# Patient Record
Sex: Female | Born: 1990 | Race: White | Hispanic: No | Marital: Single | Smoking: Never smoker
Health system: Southern US, Community
[De-identification: ages and names within clinical notes are randomized; demographics above are authoritative.]

## PROBLEM LIST (undated history)

## (undated) ENCOUNTER — Inpatient Hospital Stay (HOSPITAL_COMMUNITY): Payer: Self-pay

## (undated) DIAGNOSIS — F419 Anxiety disorder, unspecified: Secondary | ICD-10-CM

## (undated) DIAGNOSIS — F909 Attention-deficit hyperactivity disorder, unspecified type: Secondary | ICD-10-CM

## (undated) DIAGNOSIS — J45909 Unspecified asthma, uncomplicated: Secondary | ICD-10-CM

## (undated) DIAGNOSIS — F32A Depression, unspecified: Secondary | ICD-10-CM

## (undated) DIAGNOSIS — N189 Chronic kidney disease, unspecified: Secondary | ICD-10-CM

## (undated) DIAGNOSIS — E669 Obesity, unspecified: Secondary | ICD-10-CM

## (undated) DIAGNOSIS — R51 Headache: Secondary | ICD-10-CM

## (undated) DIAGNOSIS — F4310 Post-traumatic stress disorder, unspecified: Principal | ICD-10-CM

## (undated) DIAGNOSIS — F329 Major depressive disorder, single episode, unspecified: Secondary | ICD-10-CM

## (undated) HISTORY — DX: Anxiety disorder, unspecified: F41.9

## (undated) HISTORY — DX: Depression, unspecified: F32.A

## (undated) HISTORY — DX: Major depressive disorder, single episode, unspecified: F32.9

## (undated) HISTORY — DX: Obesity, unspecified: E66.9

## (undated) HISTORY — DX: Headache: R51

## (undated) HISTORY — PX: WISDOM TOOTH EXTRACTION: SHX21

## (undated) HISTORY — DX: Attention-deficit hyperactivity disorder, unspecified type: F90.9

## (undated) HISTORY — DX: Post-traumatic stress disorder, unspecified: F43.10

---
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
## 2012-01-14 ENCOUNTER — Telehealth (HOSPITAL_COMMUNITY): Payer: Self-pay

## 2012-01-14 ENCOUNTER — Other Ambulatory Visit (HOSPITAL_COMMUNITY): Payer: Self-pay | Admitting: Psychiatry

## 2012-01-14 MED ORDER — DIPHENHYDRAMINE HCL 25 MG PO CAPS: 25.0000 mg | ORAL_CAPSULE | Freq: Every evening | ORAL | Status: DC | PRN

## 2012-01-14 NOTE — Telephone Encounter (Signed)
Sent script to pharmacy. 

## 2012-02-02 ENCOUNTER — Ambulatory Visit (HOSPITAL_COMMUNITY): Payer: Self-pay | Admitting: Psychiatry

## 2012-03-22 ENCOUNTER — Other Ambulatory Visit (HOSPITAL_COMMUNITY): Payer: Self-pay | Admitting: Psychiatry

## 2012-03-30 ENCOUNTER — Other Ambulatory Visit (HOSPITAL_COMMUNITY): Payer: Self-pay | Admitting: Psychiatry

## 2012-03-30 DIAGNOSIS — F329 Major depressive disorder, single episode, unspecified: Secondary | ICD-10-CM

## 2012-03-30 MED ORDER — BUPROPION HCL ER (XL) 300 MG PO TB24: 300.0000 mg | ORAL_TABLET | Freq: Every day | ORAL | Status: DC

## 2012-04-10 ENCOUNTER — Other Ambulatory Visit: Payer: Self-pay | Admitting: Physical Medicine and Rehabilitation

## 2012-04-10 DIAGNOSIS — M79603 Pain in arm, unspecified: Secondary | ICD-10-CM

## 2012-04-10 DIAGNOSIS — M25519 Pain in unspecified shoulder: Secondary | ICD-10-CM

## 2012-04-10 DIAGNOSIS — M542 Cervicalgia: Secondary | ICD-10-CM

## 2012-04-12 ENCOUNTER — Other Ambulatory Visit (HOSPITAL_COMMUNITY): Payer: Self-pay | Admitting: Psychiatry

## 2012-04-12 MED ORDER — FLUOXETINE HCL 20 MG PO TABS: 20.0000 mg | ORAL_TABLET | Freq: Every day | ORAL | Status: DC

## 2012-04-19 ENCOUNTER — Ambulatory Visit
Admission: RE | Admit: 2012-04-19 | Discharge: 2012-04-19 | Disposition: A | Discharge status: Home / Self Care | Payer: Medicaid Other | Source: Ambulatory Visit | Attending: Physical Medicine and Rehabilitation | Admitting: Physical Medicine and Rehabilitation

## 2012-04-19 DIAGNOSIS — M25519 Pain in unspecified shoulder: Secondary | ICD-10-CM

## 2012-04-19 DIAGNOSIS — M542 Cervicalgia: Secondary | ICD-10-CM

## 2012-04-19 DIAGNOSIS — M79603 Pain in arm, unspecified: Secondary | ICD-10-CM

## 2012-04-21 ENCOUNTER — Telehealth (HOSPITAL_COMMUNITY): Payer: Self-pay

## 2012-04-21 NOTE — Telephone Encounter (Signed)
Needs appt

## 2012-04-21 NOTE — Telephone Encounter (Signed)
Needs to discuss meds 

## 2012-04-27 ENCOUNTER — Ambulatory Visit (INDEPENDENT_AMBULATORY_CARE_PROVIDER_SITE_OTHER): Payer: Medicaid Other | Admitting: Psychiatry

## 2012-04-27 VITALS — BP 112/72 | Ht 59.0 in | Wt 192.0 lb

## 2012-04-27 DIAGNOSIS — F411 Generalized anxiety disorder: Secondary | ICD-10-CM

## 2012-04-27 DIAGNOSIS — F331 Major depressive disorder, recurrent, moderate: Secondary | ICD-10-CM

## 2012-04-27 DIAGNOSIS — F329 Major depressive disorder, single episode, unspecified: Secondary | ICD-10-CM

## 2012-04-27 MED ORDER — FLUOXETINE HCL 20 MG PO TABS: 40.0000 mg | ORAL_TABLET | Freq: Every day | ORAL | Status: DC

## 2012-04-28 ENCOUNTER — Encounter (HOSPITAL_COMMUNITY): Payer: Self-pay | Admitting: Psychiatry

## 2012-04-28 NOTE — Progress Notes (Signed)
   [facility] Follow-up Outpatient Visit  Meghan [name] 07/10/1991   Subjective: The patient is an 21 year old female well known to me, as I was her treatment provider from June of 2009 up until September 2011. Her chart was closed secondary to no shows and noncompliance. The patient started coming to the clinic again this year. She reinitiated treatment with Dr. Ferol Luz in our office, and subsequently transferred care back to me. I saw her for an initial visit in February. The patient has completed middle college. She continues to date and has for 8 months. The patient has not been seen since February although she was supposed to come back in 6 weeks. She has been to [place] since last visit. She did not like it, and thought it was dirty. The patient reports today that her anxiety is worse. She states she is biting her nails off. She still trying to move out. 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 She is hoping to enter a nursing program. She plans to start attending classes at [employer]. She is asking for a note for school so that she may wear acrylic nails to keep from biting them.  Filed Vitals:   04/28/12 0857  BP: 112/72    Mental Status Examination  Appearance: Casual Alert: Yes Attention: fair  Cooperative: Yes Eye Contact: Good Speech: Regular rate rhythm and volume Psychomotor Activity: Normal Memory/Concentration: Intact Oriented: person, place, time/date and situation Mood: Euthymic Affect: Congruent Thought Processes and Associations: Logical Fund of Knowledge: Fair Thought Content: No suicidal or homicidal thoughts Insight: Fair Judgement: Fair  Diagnosis: Maj. depressive disorder, recurrent, moderate; generalized anxiety disorder  Treatment Plan: I will increase Prozac to 40 mg daily. We will continue the Wellbutrin XL, and the Benadryl for sleep. I will see  the patient back in 6 weeks. Patient counseled that she must keep appointments. She may call with concerns.  Jamse Mead, MD

## 2012-06-09 ENCOUNTER — Ambulatory Visit (INDEPENDENT_AMBULATORY_CARE_PROVIDER_SITE_OTHER): Payer: Medicaid Other | Admitting: Psychiatry

## 2012-06-09 ENCOUNTER — Encounter (HOSPITAL_COMMUNITY): Payer: Self-pay | Admitting: Psychiatry

## 2012-06-09 VITALS — BP 126/86 | Ht 59.0 in | Wt 194.0 lb

## 2012-06-09 DIAGNOSIS — F411 Generalized anxiety disorder: Secondary | ICD-10-CM

## 2012-06-09 DIAGNOSIS — F329 Major depressive disorder, single episode, unspecified: Secondary | ICD-10-CM

## 2012-06-09 DIAGNOSIS — F331 Major depressive disorder, recurrent, moderate: Secondary | ICD-10-CM

## 2012-06-09 NOTE — Progress Notes (Signed)
   [facility] Follow-up Outpatient Visit  ROBINN OVERHOLT 1991/05/23   Subjective: The patient is an 21 year old female well known to me, as I was her treatment provider from June of 2009 up until September 2011. Her chart was closed secondary to no shows and noncompliance. The patient started coming to the clinic again this year. She reinitiated treatment with Dr. Ferol Luz in our office, and subsequently transferred care back to me. I have been treating her again since February of 2013. The patient reports she has had a very long summer. 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 She presents today with her younger sister, so she somewhat guarded in the appointment. She reports her stress is still going on, but states that it should be over soon which implies to me that she is moving out of her parents house. She endorses good sleep and appetite. She still has been chewing somewhat on her nails, but is currently wearing acrylic so it does nothing. The patient does not want to change medication today.  Filed Vitals:   06/09/12 1607  BP: 126/86    Mental Status Examination  Appearance: Casual Alert: Yes Attention: fair  Cooperative: Yes Eye Contact: Good Speech: Regular rate rhythm and volume Psychomotor Activity: Normal Memory/Concentration: Intact Oriented: person, place, time/date and situation Mood: Euthymic Affect: Congruent Thought Processes and Associations: Logical Fund of Knowledge: Fair Thought Content: No suicidal or homicidal thoughts Insight: Fair Judgement: Fair  Diagnosis: Maj. depressive disorder, recurrent, moderate; generalized anxiety disorder  Treatment Plan: I will not make any changes today. I will continue the Prozac, Wellbutrin XL, and Benadryl. I will see the patient back in 2 months.  Patient may call with concerns.  Jamse Mead, MD

## 2012-06-29 ENCOUNTER — Telehealth (HOSPITAL_COMMUNITY): Payer: Self-pay

## 2012-06-29 NOTE — Telephone Encounter (Signed)
OK 

## 2012-06-30 DIAGNOSIS — Z3009 Encounter for other general counseling and advice on contraception: Secondary | ICD-10-CM | POA: Insufficient documentation

## 2012-07-04 ENCOUNTER — Ambulatory Visit (HOSPITAL_COMMUNITY): Payer: Self-pay | Admitting: Psychiatry

## 2012-08-21 ENCOUNTER — Other Ambulatory Visit (HOSPITAL_COMMUNITY): Payer: Self-pay | Admitting: Psychiatry

## 2012-08-21 MED ORDER — FLUOXETINE HCL 20 MG PO TABS: 40.0000 mg | ORAL_TABLET | Freq: Every day | ORAL | Status: DC

## 2012-08-22 ENCOUNTER — Ambulatory Visit (INDEPENDENT_AMBULATORY_CARE_PROVIDER_SITE_OTHER): Payer: Medicaid Other | Admitting: Psychiatry

## 2012-08-22 VITALS — BP 100/62 | Ht 59.0 in | Wt 196.0 lb

## 2012-08-22 DIAGNOSIS — F411 Generalized anxiety disorder: Secondary | ICD-10-CM

## 2012-08-22 DIAGNOSIS — F331 Major depressive disorder, recurrent, moderate: Secondary | ICD-10-CM

## 2012-08-22 DIAGNOSIS — F329 Major depressive disorder, single episode, unspecified: Secondary | ICD-10-CM

## 2012-08-22 LAB — OB RESULTS CONSOLE ABO/RH: RH Type: POSITIVE

## 2012-08-22 LAB — OB RESULTS CONSOLE ANTIBODY SCREEN: Antibody Screen: NEGATIVE

## 2012-08-22 LAB — OB RESULTS CONSOLE GC/CHLAMYDIA: Gonorrhea: NEGATIVE

## 2012-08-22 MED ORDER — BUPROPION HCL ER (XL) 150 MG PO TB24: 150.0000 mg | ORAL_TABLET | Freq: Every day | ORAL | Status: DC

## 2012-08-23 ENCOUNTER — Encounter (HOSPITAL_COMMUNITY): Payer: Self-pay | Admitting: Psychiatry

## 2012-08-23 NOTE — Progress Notes (Signed)
   [facility] Health Follow-up Outpatient Visit  Meghan [name] [birth year]   Subjective: The patient is an 21 year old female well known to me, as I was her treatment provider from June of 2009 up until September 2011. Her chart was closed secondary to no shows and noncompliance. The patient started coming to the clinic again this year. She reinitiated treatment with Dr. Ferol Luz in our office, and subsequently transferred care back to me. I have been treating her again since February of 2013. The patient is diagnosed with generalized anxiety disorder, Maj. depressive disorder. She reports that since her last appointment, she has stopped her medication. She is currently [redacted] weeks pregnant. She plans to move out and in with her boyfriend at [employer]. Her mom does not know. 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 The patient endorses a lot of crying spells. She has been down a lot. Grades are going well. 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 Although she has been with her current boyfriend for 2 years, her parents do not approve. He is 6 years older. She is excited about the pregnancy. Her mother does not know.  Filed Vitals:   08/23/12 0922  BP: 100/62    Mental Status Examination  Appearance: Casual Alert: Yes Attention: fair  Cooperative: Yes Eye Contact: Good Speech: Regular rate rhythm and volume Psychomotor Activity: Normal Memory/Concentration: Intact Oriented: person, place, time/date and situation Mood: Euthymic Affect: Congruent Thought Processes and Associations: Logical Fund of Knowledge: Fair Thought Content: No suicidal or homicidal thoughts Insight: Fair Judgement: Fair  Diagnosis: Maj. depressive disorder, recurrent, moderate; generalized anxiety disorder  Treatment Plan: I will restart her Wellbutrin XL at 150 mg daily. I will see the patient back in 6 weeks. Patient may  call with concerns.  Jamse Mead, MD

## 2012-10-03 ENCOUNTER — Ambulatory Visit (HOSPITAL_COMMUNITY): Payer: Self-pay | Admitting: Psychiatry

## 2012-10-27 ENCOUNTER — Ambulatory Visit (INDEPENDENT_AMBULATORY_CARE_PROVIDER_SITE_OTHER): Payer: Medicaid Other | Admitting: Psychiatry

## 2012-10-27 ENCOUNTER — Encounter (HOSPITAL_COMMUNITY): Payer: Self-pay | Admitting: Psychiatry

## 2012-10-27 VITALS — BP 104/64 | Ht 59.0 in | Wt 196.0 lb

## 2012-10-27 DIAGNOSIS — F411 Generalized anxiety disorder: Secondary | ICD-10-CM

## 2012-10-27 DIAGNOSIS — F329 Major depressive disorder, single episode, unspecified: Secondary | ICD-10-CM

## 2012-10-27 DIAGNOSIS — F331 Major depressive disorder, recurrent, moderate: Secondary | ICD-10-CM

## 2012-10-27 NOTE — Progress Notes (Signed)
   [facility] Follow-up Outpatient Visit  NELA BASCOM Jul 30, 1991   Subjective: The patient is an 21 year old female well known to me, as I was her treatment provider from June of 2009 up until September 2011. Her chart was closed secondary to no shows and noncompliance. The patient started coming to the clinic again this year. She reinitiated treatment with Dr. Ferol Luz in our office, and subsequently transferred care back to me. I have been treating her again since February of 2013. The patient is diagnosed with generalized anxiety disorder, and Maj. depressive disorder. At her last appointment, she informed that she was [redacted] weeks pregnant. She was more depressed and crying a lot. She was off her medication. I restarted her Wellbutrin XL at 150 mg daily. She presents today. She is once again off of her medication. The patient was having issues with losing weight and vomiting. She has not even allowed to take prenatal vitamins. She has her next prenatal appointment on January 4. She is the same weight today she was last visit. The patient is currently stressed. Her sister has been diagnosed with a Chiari malformation. She'll be having surgery on Monday. This is the patient's birthday. Mom is broken her arm. Maternal grandparents refuse to help because they're mad that the patient was pregnant. 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 She made straight A's. She is still waiting to move in with her boyfriend. Her mother refuses to meet him. The patient is asking to restart therapy with Darel Hong.  Filed Vitals:   10/27/12 1117  BP: 104/64    Mental Status Examination  Appearance: Casual Alert: Yes Attention: fair  Cooperative: Yes Eye Contact: Good Speech: Regular rate rhythm and volume Psychomotor Activity: Normal Memory/Concentration: Intact Oriented: person, place, time/date and situation Mood: Euthymic Affect: Congruent Thought Processes and Associations: Logical Fund  of Knowledge: Fair Thought Content: No suicidal or homicidal thoughts Insight: Fair Judgement: Fair  Diagnosis: Maj. depressive disorder, recurrent, moderate; generalized anxiety disorder  Treatment Plan: I will not continue any medication until patient has her next prenatal visit on January 4. She is to update me afterwards. I will see her back in 2 months. She may call with concerns. Patient to reschedule therapy with Darel Hong.  Jamse Mead, MD

## 2012-11-08 NOTE — L&D Delivery Note (Signed)
Delivery Note Pt progressed well on pitocin and reached complete dilation.  She had an epidural placed, but never became fully comfortable with it.  She pushed about 20 minutes and at 5:32 AM a healthy female was delivered via Vaginal, Spontaneous Delivery (Presentation: LOA).  APGAR: 8, 9; weight pending.   Placenta status: Intact, Spontaneous.  Placenta appeared a little small.   Cord: 3 vessels with the following complications: short cord.  Anesthesia: Epidural  Episiotomy: None Lacerations: Second degree Suture Repair: 3.0 vicryl rapide Est. Blood Loss (mL): 300cc  Mom to postpartum.  Baby to stay with mother. Oliver Pila 03/08/2013, 6:08 AM

## 2012-11-23 ENCOUNTER — Ambulatory Visit (HOSPITAL_COMMUNITY): Payer: Self-pay | Admitting: Licensed Clinical Social Worker

## 2012-12-03 ENCOUNTER — Inpatient Hospital Stay (HOSPITAL_COMMUNITY)
Admission: AD | Admit: 2012-12-03 | Discharge: 2012-12-05 | DRG: 781 | Disposition: A | Discharge status: Home / Self Care | Payer: Medicaid Other | Source: Ambulatory Visit | Attending: Obstetrics and Gynecology | Admitting: Obstetrics and Gynecology

## 2012-12-03 ENCOUNTER — Inpatient Hospital Stay (HOSPITAL_COMMUNITY): Payer: Medicaid Other

## 2012-12-03 ENCOUNTER — Encounter (HOSPITAL_COMMUNITY): Payer: Self-pay

## 2012-12-03 DIAGNOSIS — O26839 Pregnancy related renal disease, unspecified trimester: Principal | ICD-10-CM | POA: Diagnosis present

## 2012-12-03 DIAGNOSIS — N1330 Unspecified hydronephrosis: Secondary | ICD-10-CM | POA: Diagnosis present

## 2012-12-03 DIAGNOSIS — N132 Hydronephrosis with renal and ureteral calculous obstruction: Secondary | ICD-10-CM

## 2012-12-03 DIAGNOSIS — R109 Unspecified abdominal pain: Secondary | ICD-10-CM | POA: Diagnosis present

## 2012-12-03 DIAGNOSIS — N200 Calculus of kidney: Secondary | ICD-10-CM | POA: Diagnosis present

## 2012-12-03 LAB — COMPREHENSIVE METABOLIC PANEL
Albumin: 2.7 g/dL — ABNORMAL LOW (ref 3.5–5.2)
Alkaline Phosphatase: 85 U/L (ref 39–117)
BUN: 7 mg/dL (ref 6–23)
CO2: 22 mEq/L (ref 19–32)
Chloride: 100 mEq/L (ref 96–112)
Creatinine, Ser: 0.7 mg/dL (ref 0.50–1.10)
GFR calc non Af Amer: >90 mL/min (ref >90)
Glucose, Bld: 99 mg/dL (ref 70–99)
Potassium: 3.9 mEq/L (ref 3.5–5.1)
Total Bilirubin: 0.2 mg/dL — ABNORMAL LOW (ref 0.3–1.2)

## 2012-12-03 LAB — URINALYSIS, ROUTINE W REFLEX MICROSCOPIC
Bilirubin Urine: NEGATIVE
Glucose, UA: NEGATIVE mg/dL
Ketones, ur: NEGATIVE mg/dL
Leukocytes, UA: NEGATIVE
Protein, ur: NEGATIVE mg/dL
pH: 6 (ref 5.0–8.0)

## 2012-12-03 LAB — URINE MICROSCOPIC-ADD ON

## 2012-12-03 MED ORDER — HYDROMORPHONE HCL PF 1 MG/ML IJ SOLN
2.0000 mg | INTRAMUSCULAR | Status: DC | PRN
  Administered 2012-12-04: 1 mg via INTRAVENOUS
  Administered 2012-12-04 (×2): 2 mg via INTRAVENOUS
  Administered 2012-12-04: 1 mg via INTRAVENOUS
  Administered 2012-12-04 (×4): 2 mg via INTRAVENOUS
  Filled 2012-12-03: qty 2
  Filled 2012-12-03: qty 1
  Filled 2012-12-03 (×5): qty 2
  Filled 2012-12-03: qty 1

## 2012-12-03 MED ORDER — DOCUSATE SODIUM 100 MG PO CAPS
100.0000 mg | ORAL_CAPSULE | Freq: Every day | ORAL | Status: DC
  Administered 2012-12-03 – 2012-12-05 (×3): 100 mg via ORAL
  Filled 2012-12-03 (×3): qty 1

## 2012-12-03 MED ORDER — HYDROMORPHONE HCL PF 1 MG/ML IJ SOLN
2.0000 mg | INTRAMUSCULAR | Status: DC | PRN
  Administered 2012-12-03 (×6): 2 mg via INTRAVENOUS
  Filled 2012-12-03 (×6): qty 2

## 2012-12-03 MED ORDER — HYDROMORPHONE HCL PF 1 MG/ML IJ SOLN
2.0000 mg | Freq: Once | INTRAMUSCULAR | Status: AC
  Administered 2012-12-03: 2 mg via INTRAMUSCULAR
  Filled 2012-12-03: qty 2

## 2012-12-03 MED ORDER — ZOLPIDEM TARTRATE 5 MG PO TABS
5.0000 mg | ORAL_TABLET | Freq: Every evening | ORAL | Status: DC | PRN
  Administered 2012-12-04: 5 mg via ORAL
  Filled 2012-12-03: qty 1

## 2012-12-03 MED ORDER — PRENATAL MULTIVITAMIN CH
1.0000 | ORAL_TABLET | Freq: Every day | ORAL | Status: DC
  Administered 2012-12-03: 1 via ORAL
  Filled 2012-12-03: qty 1

## 2012-12-03 MED ORDER — LACTATED RINGERS IV SOLN
INTRAVENOUS | Status: DC
  Administered 2012-12-03: 150 mL via INTRAVENOUS
  Administered 2012-12-03 – 2012-12-04 (×6): via INTRAVENOUS
  Administered 2012-12-05: 150 mL/h via INTRAVENOUS
  Administered 2012-12-05: 16:00:00 via INTRAVENOUS

## 2012-12-03 MED ORDER — HYDROMORPHONE HCL PF 1 MG/ML IJ SOLN
1.0000 mg | Freq: Once | INTRAMUSCULAR | Status: AC
  Administered 2012-12-03: 1 mg via INTRAVENOUS
  Filled 2012-12-03: qty 1

## 2012-12-03 MED ORDER — ACETAMINOPHEN 325 MG PO TABS
650.0000 mg | ORAL_TABLET | ORAL | Status: DC | PRN
  Administered 2012-12-04: 650 mg via ORAL
  Filled 2012-12-03: qty 2

## 2012-12-03 MED ORDER — CALCIUM CARBONATE ANTACID 500 MG PO CHEW
2.0000 | CHEWABLE_TABLET | ORAL | Status: DC | PRN
  Administered 2012-12-04: 400 mg via ORAL
  Filled 2012-12-03: qty 2

## 2012-12-03 MED ORDER — LACTATED RINGERS IV BOLUS (SEPSIS)
500.0000 mL | Freq: Once | INTRAVENOUS | Status: AC
  Administered 2012-12-03: 500 mL via INTRAVENOUS

## 2012-12-03 MED ORDER — ONDANSETRON HCL 4 MG/2ML IJ SOLN
4.0000 mg | Freq: Four times a day (QID) | INTRAMUSCULAR | Status: DC | PRN
  Administered 2012-12-03 – 2012-12-05 (×4): 4 mg via INTRAVENOUS
  Filled 2012-12-03 (×4): qty 2

## 2012-12-03 NOTE — MAU Note (Signed)
Onset of back pain since 10:00 p.m. Denies vaginal bleeding.

## 2012-12-03 NOTE — Progress Notes (Signed)
Pain is mostly L flank coming around to left side of abdomen

## 2012-12-03 NOTE — MAU Provider Note (Signed)
  History     CSN: [id number]  Arrival date and time: 12/03/12 0203   First Provider Initiated Contact with Patient 12/03/12 [phone]      Chief Complaint  Patient presents with  . Back Pain   HPI  Pt is a G1P0 at 25.2 wks IUP here with report of back pain that started last night.  Pain is on lower right side and radiated around to the front lower pelvic.  No report of vaginal bleeding or problems in pregnancy.    Past Medical History  Diagnosis Date  . Anxiety   . Depression   . Headache   . Obesity (BMI 30-39.9)     Pt  doesn't  "eat much"  does not eat veggies/fruit  . ADHD (attention deficit hyperactivity disorder)     No past surgical history on file.  Family History  Problem Relation Age of Onset  . Bipolar disorder Mother   . Anxiety disorder Mother   . Bipolar disorder Maternal Grandmother   . Depression Cousin   . Bipolar disorder Other     History  Substance Use Topics  . Smoking status: Never Smoker   . Smokeless tobacco: Never Used  . Alcohol Use: No    Allergies: No Known Allergies  Prescriptions prior to admission  Medication Sig Dispense Refill  . buPROPion (WELLBUTRIN XL) 150 MG 24 hr tablet Take 1 tablet (150 mg total) by mouth daily after breakfast.  30 tablet  2  . diphenhydrAMINE (BENADRYL) 25 mg capsule Take 1 capsule (25 mg total) by mouth at bedtime as needed for itching or sleep. Take 1-2 at bedtime for sleep  60 capsule  1  . HYDROcodone-acetaminophen (VICODIN) 5-500 MG per tablet       . metaxalone (SKELAXIN) 800 MG tablet       . montelukast (SINGULAIR) 10 MG tablet         Review of Systems  Gastrointestinal: Positive for abdominal pain.  Musculoskeletal: Positive for back pain (lower right side).  All other systems reviewed and are negative.   Physical Exam   Blood pressure 111/67, pulse 87, temperature 97.5 F (36.4 C), temperature source Oral, resp. rate 18, height 4\' 11"  (1.499 m), weight 91.445 kg (201 lb 9.6  oz).  Physical Exam  Constitutional: She is oriented to person, place, and time. She appears well-developed and well-nourished.       Appears uncomfortable  HENT:  Head: Normocephalic.  Neck: Normal range of motion. Neck supple.  Cardiovascular: Normal rate, regular rhythm and normal heart sounds.   Respiratory: Effort normal and breath sounds normal.  GI: Soft. There is no tenderness.  Genitourinary: No bleeding around the vagina. Vaginal discharge (mucusy) found.       Cervix - closed, thick   Musculoskeletal:       Arms: Neurological: She is alert and oriented to person, place, and time.  Skin: Skin is warm and dry.    MAU Course  Procedures  Ultrasound: IMPRESSION:  1. Mild left-sided hydronephrosis noted; no left-sided ureteral jet seen. This raises concern for distal obstruction, due to either a distal stone or possibly hydronephrosis of pregnancy. 2. 3 mm nonobstructing stone noted at the interpole region of the left kidney.  [id number] Dr. Senaida Ores assumes care of patient.    Assessment and Plan    [facility] 12/03/2012, 3:07 AM

## 2012-12-03 NOTE — Progress Notes (Signed)
Dr. Senaida Ores notified that the pt has had of urinary output in the past 6hrs. Will cont to observe.

## 2012-12-03 NOTE — H&P (Signed)
Meghan [name] is a 22 y.o. female G1P0 at 26+ weeks gestation (EDD5/9/14 by 9 week [place]) presenting for onset of flank pain this pm that she rated a 10/10.  UA shows hematuria and renal US shows probable left kidney stone, possibly obstructing as no ureteral jet seen.  Pt will be admitted for pain control and observation to see if can pass stone.  Maternal Medical History:  Prenatal complications: Nephrolithiasis.     OB History    Grav Para Term Preterm Abortions TAB SAB Ect Mult Living   1              Past Medical History  Diagnosis Date  . Anxiety   . Depression   . Headache   . Obesity (BMI 30-39.9)     Pt  doesn't  "eat much"  does not eat veggies/fruit  . ADHD (attention deficit hyperactivity disorder)    No past surgical history on file. Family History: family history includes Anxiety disorder in her mother; Bipolar disorder in her maternal grandmother, mother, and other; and Depression in her cousin. Social History:  reports that she has never smoked. She has never used smokeless tobacco. She reports that she does not drink alcohol or use illicit drugs.   Prenatal Transfer Tool  Maternal Diabetes: not yet tested Genetic Screening: Normal Maternal Ultrasounds/Referrals: Normal Fetal Ultrasounds or other Referrals:  None Maternal Substance Abuse:  No Significant Maternal Medications:  None Significant Maternal Lab Results:  None Other Comments:  None  Review of Systems  Genitourinary: Positive for hematuria and flank pain.      Blood pressure 111/67, pulse 87, temperature 97.5 F (36.4 C), temperature source Oral, resp. rate 18, height 4\' 11"  (1.499 m), weight 91.445 kg (201 lb 9.6 oz). Maternal Exam:  Abdomen: Patient reports no abdominal tenderness. Introitus: Normal vulva. Normal vagina.    Physical Exam  Constitutional: She is oriented to person, place, and time. She appears well-developed and well-nourished.  Cardiovascular: Normal rate and regular rhythm.     Respiratory: Breath sounds normal.  GI: Soft.  Genitourinary: Vagina normal.  Neurological: She is alert and oriented to person, place, and time.    Prenatal labs: ABO, Rh:  A positive Antibody:  negative Rubella:  immune RPR:   NR HBsAg:   Neg HIV:   NR GBS:   not yet known  Assessment/Plan: Pt with nephrolithiasis based on findings of hematuria and [place] suggestive of left stone.  Will admit for IV pain control and hope stone will pass.  Oliver Pila 12/03/2012, 4:59 AM

## 2012-12-03 NOTE — Progress Notes (Signed)
Report called to [facility] in [facility]. Pt to 158 via w/c

## 2012-12-03 NOTE — Progress Notes (Signed)
Medicated for nausea.

## 2012-12-03 NOTE — Progress Notes (Signed)
Pt moves a lot and difficult to monitor baby

## 2012-12-03 NOTE — Progress Notes (Signed)
Patient ID: Meghan [name], female   DOB: 11-11-1990, 22 y.o.   MRN: [id number] Pt required another dose of dilaudid at 0630am, now going to rest. D/w her plan of care.

## 2012-12-04 ENCOUNTER — Observation Stay (HOSPITAL_COMMUNITY): Payer: Medicaid Other

## 2012-12-04 ENCOUNTER — Encounter (HOSPITAL_COMMUNITY): Payer: Self-pay | Admitting: *Deleted

## 2012-12-04 LAB — CBC
MCV: 86.7 fL (ref 78.0–100.0)
Platelets: 136 10*3/uL — ABNORMAL LOW (ref 150–400)
RBC: 3.45 MIL/uL — ABNORMAL LOW (ref 3.87–5.11)
RDW: 14 % (ref 11.5–15.5)
WBC: 11 10*3/uL — ABNORMAL HIGH (ref 4.0–10.5)

## 2012-12-04 LAB — COMPREHENSIVE METABOLIC PANEL
ALT: 18 U/L (ref 0–35)
AST: 16 U/L (ref 0–37)
Albumin: 2.1 g/dL — ABNORMAL LOW (ref 3.5–5.2)
Calcium: 8.2 mg/dL — ABNORMAL LOW (ref 8.4–10.5)
Chloride: 101 mEq/L (ref 96–112)
Creatinine, Ser: 0.85 mg/dL (ref 0.50–1.10)
Sodium: 135 mEq/L (ref 135–145)

## 2012-12-04 MED ORDER — HYDROMORPHONE HCL PF 1 MG/ML IJ SOLN: 1.0000 mg | INTRAMUSCULAR | Status: DC | PRN

## 2012-12-04 MED ORDER — HYDROMORPHONE HCL 2 MG PO TABS
4.0000 mg | ORAL_TABLET | ORAL | Status: DC | PRN
  Administered 2012-12-04 – 2012-12-05 (×7): 4 mg via ORAL
  Filled 2012-12-04: qty 2
  Filled 2012-12-04: qty 1
  Filled 2012-12-04 (×5): qty 2

## 2012-12-04 MED ORDER — TAMSULOSIN HCL 0.4 MG PO CAPS
0.4000 mg | ORAL_CAPSULE | Freq: Every day | ORAL | Status: DC
  Administered 2012-12-04 – 2012-12-05 (×2): 0.4 mg via ORAL
  Filled 2012-12-04 (×3): qty 1

## 2012-12-04 NOTE — Progress Notes (Signed)
Incentive spirometer given to pt and able to   reach 1700 ml on initial to 2500 ml evoking cough each time.  Pt encouraged to ambulate.  Caffeine drink offered to pt for complaint of HA pt reluctant to drink.

## 2012-12-04 NOTE — Progress Notes (Signed)
Patient ID: Meghan [name], female   DOB: 16-Aug-1991, 22 y.o.   MRN: [id number]   I reviewed the transvaginal ultrasound images and called and discussed these with the patient's mother Marchelle Folks who is with the patient. The transvaginal ultrasound showed bilateral ureteral jets and did not show dilation of the distal ureters.  Again I discussed with Flonnie's this doesn't rule out a ureteral stone and obstruction but it is reassuring to see bilateral a ureteral jets andno obviousLarge stones. We'll continue to monitor the patient overnight.  I'll repeat her renal ultrasound in the morning.

## 2012-12-04 NOTE — Progress Notes (Signed)
To ultrasound

## 2012-12-04 NOTE — Progress Notes (Signed)
Pt tolerated ambulating in hallway with family well complaining only of headache at this time.

## 2012-12-04 NOTE — Progress Notes (Signed)
Ur chart review completed.  

## 2012-12-04 NOTE — Progress Notes (Signed)
HD #2, 25W 3D, kidney stone Still having pain on left, requiring PO Dilaudid, nauseated Afeb, VSS Will add Flomax and get Urology eval today

## 2012-12-04 NOTE — Consult Note (Signed)
Consult:Left hydronpehrosis Flank pain IUP Nephrolithiasis Requested by Dr. Jackelyn Knife   History of Present Illness: 22 y.o. Female admitted with acute onset left flank pain yesterday. She is G1P0 at 26+ weeks gestation (EDD5/9/14 by 9 week [place]).   She continues to have left flank pain and is taking IV pain meds regularly. She has not taken po pain meds. Pain radiates into LLQ. She has had pain like this before and underwent MRI about 6 months ago for it.    This is her first pregnancy and first stone episode.    Past Medical History  Diagnosis Date  . Anxiety   . Depression   . Headache   . Obesity (BMI 30-39.9)     Pt  doesn't  "eat much"  does not eat veggies/fruit  . ADHD (attention deficit hyperactivity disorder)    History reviewed. No pertinent past surgical history.  Home Medications:  Prescriptions prior to admission  Medication Sig Dispense Refill  . calcium carbonate (TUMS - DOSED IN MG ELEMENTAL CALCIUM) 500 MG chewable tablet Chew 2 tablets by mouth 3 (three) times daily as needed. For heartburn      . Doxylamine-Pyridoxine (DICLEGIS) 10-10 MG TBEC Take 1 tablet by mouth 3 (three) times daily as needed. For nausea      . Prenatal Vit-Fe Fumarate-FA (PRENATAL MULTIVITAMIN) TABS Take 1 tablet by mouth at bedtime.       Allergies: No Known Allergies  Family History  Problem Relation Age of Onset  . Bipolar disorder Mother   . Anxiety disorder Mother   . Bipolar disorder Maternal Grandmother   . Depression Cousin   . Bipolar disorder Other    Social History:  reports that she has never smoked. She has never used smokeless tobacco. She reports that she does not drink alcohol or use illicit drugs.  ROS: A complete review of systems was performed.  All systems are negative except for pertinent findings as noted. @ROS @   Physical Exam:  Vital signs in last 24 hours: Chaperone -- MOP Temp:  [97.3 F (36.3 C)-98.5 F (36.9 C)] 98.3 F (36.8 C) (01/27  0805) Pulse Rate:  [57-139] 103  (01/27 1156) Resp:  [16-20] 20  (01/27 0805) BP: (97-119)/(43-81) 104/58 mmHg (01/27 0805) SpO2:  [86 %-99 %] 95 % (01/27 1156) General:  Alert and oriented, No acute distress HEENT: Normocephalic, atraumatic Cardiovascular: Regular rate and rhythm Lungs: Regular rate and effort Abdomen: Soft, nontender Back: mild left CVA tenderness Extremities: No edema Neurologic: Grossly intact  Laboratory Data:  Results for orders placed during the hospital encounter of 12/03/12 (from the past 24 hour(s))  CBC     Status: Abnormal   Collection Time   12/04/12  5:20 AM      Component Value Range   WBC 11.0 (*) 4.0 - 10.5 K/uL   RBC 3.45 (*) 3.87 - 5.11 MIL/uL   Hemoglobin 9.6 (*) 12.0 - 15.0 g/dL   HCT 16.1 (*) 09.6 - 04.5 %   MCV 86.7  78.0 - 100.0 fL   MCH 27.8  26.0 - 34.0 pg   MCHC 32.1  30.0 - 36.0 g/dL   RDW 40.9  81.1 - 91.4 %   Platelets 136 (*) 150 - 400 K/uL  COMPREHENSIVE METABOLIC PANEL     Status: Abnormal   Collection Time   12/04/12  5:20 AM      Component Value Range   Sodium 135  135 - 145 mEq/L   Potassium 4.0  3.5 -  5.1 mEq/L   Chloride 101  96 - 112 mEq/L   CO2 26  19 - 32 mEq/L   Glucose, Bld 81  70 - 99 mg/dL   BUN 6  6 - 23 mg/dL   Creatinine, Ser 4.09  0.50 - 1.10 mg/dL   Calcium 8.2 (*) 8.4 - 10.5 mg/dL   Total Protein 4.8 (*) 6.0 - 8.3 g/dL   Albumin 2.1 (*) 3.5 - 5.2 g/dL   AST 16  0 - 37 U/L   ALT 18  0 - 35 U/L   Alkaline Phosphatase 72  39 - 117 U/L   Total Bilirubin 0.3  0.3 - 1.2 mg/dL   GFR calc non Af Amer >90  >90 mL/min   GFR calc Af Amer >90  >90 mL/min   No results found for this or any previous visit (from the past 240 hour(s)). Creatinine:  Basename 12/04/12 0520 12/03/12 0528  CREATININE 0.85 0.70   U/A - TNTC rbc's.   Renal U/S - mild prominence of left renal pelvis. Calyces not really seen nor dilated. No shadowing of hyperechoic area (poss stone) on the U/S. Right kidney normal. I reviewed all the  images.   Impression/Assessment:  Left hydronpehrosis Flank pain IUP Nephrolithiasis  Discussed with patient and MOP U/S findings and the limitations of U/S to pinpoint cause and location of obstruction. We discussed the nature, R/B of TV U/S, MRI and CT. We discussed the nature, R/B of continued supportive medical care or proceeding with cystoscopy, poss left RGP, left URS, laser lithotripsy and stent placement. All questions answered. They will consider.   Fortunately she has no F/C, N/V and is tolerating po. Also, her Cr is normal.   Plan:  -TV U/S - this may show a dilated ureter and can show a stone near the bladder and UVJ. May show a ureteral jet. If equivocal I may repeat renal U/S in AM.  -continue tamsulosin -consider po pain meds with IV only for breakthrough pain. Sometimes po holds pain better and would be a good trial to see if she can go home on po pain meds.   Cc: Dr. Dreama Saa, Lowella Petties 12/04/2012, 1:21 PM

## 2012-12-05 ENCOUNTER — Inpatient Hospital Stay (HOSPITAL_COMMUNITY): Payer: Medicaid Other

## 2012-12-05 ENCOUNTER — Encounter (HOSPITAL_COMMUNITY): Payer: Self-pay | Admitting: *Deleted

## 2012-12-05 DIAGNOSIS — N1330 Unspecified hydronephrosis: Secondary | ICD-10-CM | POA: Diagnosis present

## 2012-12-05 LAB — CBC
Hemoglobin: 10.3 g/dL — ABNORMAL LOW (ref 12.0–15.0)
MCH: 28.9 pg (ref 26.0–34.0)
MCHC: 33.1 g/dL (ref 30.0–36.0)
MCV: 87.1 fL (ref 78.0–100.0)
RBC: 3.57 MIL/uL — ABNORMAL LOW (ref 3.87–5.11)

## 2012-12-05 MED ORDER — HYDROMORPHONE HCL 4 MG PO TABS: 4.0000 mg | ORAL_TABLET | ORAL | Status: DC | PRN

## 2012-12-05 MED ORDER — COMPLETENATE 29-1 MG PO CHEW
1.0000 | CHEWABLE_TABLET | Freq: Every day | ORAL | Status: DC
  Administered 2012-12-05: 1 via ORAL
  Filled 2012-12-05 (×2): qty 1

## 2012-12-05 MED ORDER — TAMSULOSIN HCL 0.4 MG PO CAPS: 0.4000 mg | ORAL_CAPSULE | Freq: Every day | ORAL | Status: DC

## 2012-12-05 NOTE — Progress Notes (Signed)
Patient ID: Meghan [name], female   DOB: December 21, 1990, 22 y.o.   MRN: [id number]    I called and spoke to [facility]. Pt's pain seems better. The renal bladder U/S shows some Dilation of both collecting systems with good ureteral jets. There are no concerning findings.  I called and discussed the findings with the patient's mother. Her mom asked if she could go home. She can go home from my point of view. I would keep her on tamsulosin for another few days and then stop it.  Her mother asked that the pain could be coming from another source outside the urinary tract and I told her this is a possibility but not likely. She has been eating and drinking well and having flatus and bowel movements therefore her GI system seems to be working properly. We discussed could be having pain of a musculoskeletal nature.  If she remains in hospital and continues to have uncontrolled pain, From a urologic point of view we would consider other diagnostics such as MRI or CT.

## 2012-12-05 NOTE — Progress Notes (Signed)
HD #3, [redacted]W[redacted]D, kidney stone Feels a little better, still in pain Afeb, VSS Urology input greatly appreciated.  Will await recommendations today.  Discussed with pt that she may be able to go home with pain meds.

## 2012-12-05 NOTE — Progress Notes (Signed)
  Subjective: Patient reports continued left flank. She states her urine has cleared and is now "yellow". She and her mom say she couldn't go home as she is in too much pain. I asked her how this pain compares to her pain from this past summer. She said this pain is more severe and different in character.   Objective: Vital signs in last 24 hours: Temp:  [98.2 F (36.8 C)-98.4 F (36.9 C)] 98.4 F (36.9 C) (01/27 2249) Pulse Rate:  [90-129] 125  (01/27 2249) Resp:  [16-20] 20  (01/27 2249) BP: (97-117)/(53-75) 97/53 mmHg (01/27 2249) SpO2:  [86 %-99 %] 99 % (01/27 1951)  Intake/Output from previous day: 01/27 0701 - 01/28 0700 In: -  Out: 2000 [Urine:2000] Intake/Output this shift:    Physical Exam:  General:alert GI: soft, non tender, minimal left flank pain   Assessment/Plan: Hydronephrosis Flank pain IUP Nephrolithiasis  -We discussed proceeding to the OR for cysto/stent placement possible ureteroscopy for uncontroleld pain but they want to hold off. They would like to get "more information". I'm going to repeat the renal U/S to assess the degree of hydronephrosis.  -she is AFVSS so there is no urgent need for surgical intervention. Continue supportive care.   LOS: 2 days   Antony Haste 12/05/2012, 10:18 AM

## 2012-12-05 NOTE — Discharge Summary (Signed)
Physician Discharge Summary  Patient ID: Meghan [name] MRN: [id number] DOB/AGE: September 10, 1991 21 y.o.  Admit date: 12/03/2012 Discharge date: 12/05/2012  Admission Diagnoses:  IUP at 25+ weeks, flank pain, hydronephrosis, possible kidney stone  Discharge Diagnoses:  Same Active Problems:  Hydronephrosis   Discharged Condition: fair  Hospital Course: Admitted, IV fluids, pain meds.  On HD #2 minimal improvement in pain, flomax added and Urology consult obtained.  Vaginal ultrasound with bilateral ureteral jets, unable to locate stone.  Pm 1-28 pain slightly improved, felt to be stable enough to [name]/c home on pain meds.  Consults: urology  Significant Diagnostic Studies: ultrasounds  Treatments: IV hydration and analgesia: Dilaudid  Discharge Exam: Blood pressure 100/49, pulse 94, temperature 98.2 F (36.8 C), temperature source Oral, resp. rate 20, height 4\' 11"  (1.499 m), weight 91.173 kg (201 lb), SpO2 97.00%. General appearance: alert  Disposition: Final discharge disposition not confirmed  Discharge Orders    Future Orders Please Complete By Expires   Discharge instructions      Comments:   Call for increasing pain, nausea, vomiting or temp of 100.4 or greater   PRETERM LABOR:  Includes any of the follwing symptoms that occur between 20 - [redacted] weeks gestation.  If these symptoms are not stopped, preterm labor can result in preterm delivery, placing your baby at risk      Notify physician for menstrual like cramps      Notify physician for uterine contractions.  These may be painless and feel like the uterus is tightening or the baby is  "balling up"      Notify physician for low, dull backache, unrelieved by heat or Tylenol      Notify physician for intestinal cramps, with or without diarrhea, sometimes described as "gas pain"      Notify physician for pelvic pressure      Notify physician for increase or change in vaginal discharge      Notify physician for vaginal bleeding        Notify physician for a general feeling that "something is not right"      Notify physician for leaking of fluid      Discharge activity:  No Restrictions      Discharge diet:  No restrictions      No sexual activity restrictions          Medication List     As of 12/05/2012  5:40 PM    TAKE these medications         calcium carbonate 500 MG chewable tablet   Commonly known as: TUMS - dosed in mg elemental calcium   Chew 2 tablets by mouth 3 (three) times daily as needed. For heartburn      DICLEGIS 10-10 MG Tbec   Generic drug: Doxylamine-Pyridoxine   Take 1 tablet by mouth 3 (three) times daily as needed. For nausea      HYDROmorphone 4 MG tablet   Commonly known as: DILAUDID   Take 1 tablet (4 mg total) by mouth every 3 (three) hours as needed.      prenatal multivitamin Tabs   Take 1 tablet by mouth at bedtime.      Tamsulosin HCl 0.4 MG Caps   Commonly known as: FLOMAX   Take 1 capsule (0.4 mg total) by mouth daily.           Follow-up Information    Follow up with Meghan Kretsch D, MD. Schedule an appointment as soon as possible for a visit  in 3 days.   Contact information:   [address] [place] [address] [phone]          Signed: Zenaida [name] 12/05/2012, 5:40 PM

## 2012-12-14 ENCOUNTER — Ambulatory Visit (HOSPITAL_COMMUNITY): Payer: Self-pay | Admitting: Licensed Clinical Social Worker

## 2012-12-15 ENCOUNTER — Other Ambulatory Visit: Payer: Self-pay | Admitting: Obstetrics and Gynecology

## 2012-12-15 DIAGNOSIS — R109 Unspecified abdominal pain: Secondary | ICD-10-CM

## 2012-12-26 ENCOUNTER — Encounter (HOSPITAL_COMMUNITY): Payer: Self-pay | Admitting: Licensed Clinical Social Worker

## 2012-12-28 ENCOUNTER — Encounter (HOSPITAL_COMMUNITY): Payer: Self-pay | Admitting: Psychiatry

## 2012-12-28 ENCOUNTER — Ambulatory Visit (INDEPENDENT_AMBULATORY_CARE_PROVIDER_SITE_OTHER): Payer: Federal, State, Local not specified - Other | Admitting: Psychiatry

## 2012-12-28 VITALS — BP 106/62 | Wt 204.0 lb

## 2012-12-28 MED ORDER — BUPROPION HCL ER (XL) 150 MG PO TB24: 150.0000 mg | ORAL_TABLET | ORAL | Status: DC

## 2012-12-28 NOTE — Progress Notes (Signed)
   [facility] Follow-up Outpatient Visit  Meghan [name] 03-25-1991   Subjective: The patient is an 22 year old female well known to me, as I was her treatment provider from June of 2009 up until September 2011. Her chart was closed secondary to no shows and noncompliance. The patient started coming to the clinic again this year. She reinitiated treatment with Dr. Ferol Luz in our office, and subsequently transferred care back to me. I have been treating her again since February of 2013. The patient is diagnosed with generalized anxiety disorder, and Maj. depressive disorder. At her last appointment, she continued to be off medication. She had a prenatal visit pending. She was to call me on January 4 after the visit. She did not call. She presents today. She continues to nursing school. She has had kidney stones for approximately one month. She was not supposed to be attending classes but continued attending. She currently has all A's. She has not moved out of her mother's house yet. Mom had a spinal fusion pending. Her sister did have her surgery repair for her Chiari malformation. Her sister cannot go back to school until April. The patient has been advised by her prenatal OB/GYN she is allowed to start antidepressants again. The patient reports she's not sleeping secondary to pain. She is currently taking a lot of, but does not want anything that would impact the baby. She still has low mood and low energy. She is asking for medication for depression. There is no suicidality.  Filed Vitals:   12/28/12 1523  BP: 106/62    Mental Status Examination  Appearance: Casual Alert: Yes Attention: fair  Cooperative: Yes Eye Contact: Good Speech: Regular rate rhythm and volume Psychomotor Activity: Normal Memory/Concentration: Intact Oriented: person, place, time/date and situation Mood: Euthymic Affect: Congruent Thought Processes and Associations: Logical Fund of Knowledge:  Fair Thought Content: No suicidal or homicidal thoughts Insight: Fair Judgement: Fair  Diagnosis: Maj. depressive disorder, recurrent, moderate; generalized anxiety disorder  Treatment Plan: I will start Wellbutrin XL 150 mg daily. I will see the patient back in 6 weeks. Risks, benefits, and side effects discussed. Patient may call with concerns.  Jamse Mead, MD

## 2013-01-25 ENCOUNTER — Encounter (HOSPITAL_COMMUNITY): Payer: Self-pay | Admitting: Family

## 2013-01-25 ENCOUNTER — Inpatient Hospital Stay (HOSPITAL_COMMUNITY)
Admission: AD | Admit: 2013-01-25 | Discharge: 2013-01-25 | Disposition: A | Discharge status: Home / Self Care | Payer: Medicaid Other | Source: Ambulatory Visit | Attending: Obstetrics and Gynecology | Admitting: Obstetrics and Gynecology

## 2013-01-25 DIAGNOSIS — J019 Acute sinusitis, unspecified: Secondary | ICD-10-CM | POA: Insufficient documentation

## 2013-01-25 DIAGNOSIS — O99891 Other specified diseases and conditions complicating pregnancy: Secondary | ICD-10-CM | POA: Insufficient documentation

## 2013-01-25 DIAGNOSIS — R509 Fever, unspecified: Secondary | ICD-10-CM | POA: Insufficient documentation

## 2013-01-25 DIAGNOSIS — R109 Unspecified abdominal pain: Secondary | ICD-10-CM | POA: Insufficient documentation

## 2013-01-25 HISTORY — DX: Chronic kidney disease, unspecified: N18.9

## 2013-01-25 HISTORY — DX: Unspecified asthma, uncomplicated: J45.909

## 2013-01-25 LAB — URINALYSIS, ROUTINE W REFLEX MICROSCOPIC
Ketones, ur: NEGATIVE mg/dL
Leukocytes, UA: NEGATIVE
Nitrite: NEGATIVE
Protein, ur: NEGATIVE mg/dL
Urobilinogen, UA: 0.2 mg/dL (ref 0.0–1.0)

## 2013-01-25 MED ORDER — CYCLOBENZAPRINE HCL 10 MG PO TABS: 10.0000 mg | ORAL_TABLET | Freq: Two times a day (BID) | ORAL | Status: DC | PRN

## 2013-01-25 MED ORDER — AMOXICILLIN-POT CLAVULANATE 875-125 MG PO TABS: 1.0000 | ORAL_TABLET | Freq: Two times a day (BID) | ORAL | Status: DC

## 2013-01-25 MED ORDER — CYCLOBENZAPRINE HCL 10 MG PO TABS
10.0000 mg | ORAL_TABLET | Freq: Once | ORAL | Status: AC
  Administered 2013-01-25: 10 mg via ORAL
  Filled 2013-01-25: qty 1

## 2013-01-25 NOTE — MAU Note (Signed)
Was dx with sinus infection earlier in the week, taking sudafed.  Was told to call if symptoms worsened or developed fever.  Temp this a.m. Was 100.5.  abd cramping started this morning. No bleeding or leaking or hx of PTL.

## 2013-01-25 NOTE — MAU Provider Note (Signed)
History     CSN: [id number]  Arrival date and time: 01/25/13 0901   First Provider Initiated Contact with Patient 01/25/13 [phone]      Chief Complaint  Patient presents with  . Contractions  . Fever   HPI Ms. Meghan [name] is a 22 y.o. G1P0000 at [redacted]w[redacted]d who presents to MAU today with complaint of lower abdominal discomfort and fever. The abdominal discomfort started at 2 am. 5/10 now. Pain is located in the mid suprapubic region. The patient describes the pain as sharp and constant. There is no difference in the pain with ambulation or change or positions. Denies vaginal bleeding, abnormal vaginal discharge, LOF, contractions. Reports good fetal movement. Last seen in the office on Tuesday. Next appointment is in 2 weeks. Patient states that URI symptoms have been persistent x 14 days. She is having significant sinus pressure and pain. She was febrile prior to coming in the am.   OB History   Grav Para Term Preterm Abortions TAB SAB Ect Mult Living   1 0 0 0 0 0 0 0 0 0       Past Medical History  Diagnosis Date  . Anxiety   . Depression   . Headache   . Obesity (BMI 30-39.9)     Pt  doesn't  "eat much"  does not eat veggies/fruit  . ADHD (attention deficit hyperactivity disorder)   . Chronic kidney disease     kidney stones  . Asthma     Past Surgical History  Procedure Laterality Date  . No past surgeries      Family History  Problem Relation Age of Onset  . Bipolar disorder Mother   . Anxiety disorder Mother   . Bipolar disorder Maternal Grandmother   . Depression Cousin   . Bipolar disorder Other     History  Substance Use Topics  . Smoking status: Never Smoker   . Smokeless tobacco: Never Used  . Alcohol Use: No    Allergies: No Known Allergies  Prescriptions prior to admission  Medication Sig Dispense Refill  . calcium carbonate (TUMS - DOSED IN MG ELEMENTAL CALCIUM) 500 MG chewable tablet Chew 2 tablets by mouth as needed. For heartburn      .  Cyclobenzaprine HCl (FLEXERIL PO) Take 1 tablet by mouth as needed (muscle pain).      Marland Kitchen diphenhydrAMINE (BENADRYL) 12.5 MG/5ML elixir Take 12.5 mg by mouth as needed for allergies.      Marland Kitchen HYDROmorphone (DILAUDID) 4 MG tablet Take 4 mg by mouth every 3 (three) hours as needed.      . Prenatal Vit-Fe Fumarate-FA (PRENATAL MULTIVITAMIN) TABS Take 1 tablet by mouth at bedtime.      . Pseudoephedrine-DM-GG (SUDAFED COUGH PO) Take 2 tablets by mouth every 6 (six) hours. For congestion      . [DISCONTINUED] HYDROmorphone (DILAUDID) 4 MG tablet Take 1 tablet (4 mg total) by mouth every 3 (three) hours as needed.  30 tablet  0    Review of Systems  Constitutional: Positive for fever. Negative for malaise/fatigue.  HENT: Positive for congestion and sore throat.   Respiratory: Positive for cough.   Gastrointestinal: Positive for abdominal pain. Negative for nausea, vomiting, diarrhea and constipation.  Genitourinary: Negative for dysuria, urgency and frequency.       Neg - vaginal discharge Neg - vaginal bleeding  Neurological: Negative for dizziness.   Physical Exam   Blood pressure 110/51, pulse 98, temperature 98.4 F (36.9 C), temperature source Oral,  resp. rate 18.  Physical Exam  Constitutional: She is oriented to person, place, and time. She appears well-developed and well-nourished. No distress.  HENT:  Head: Normocephalic and atraumatic.  Nose: Rhinorrhea present. Right sinus exhibits maxillary sinus tenderness and frontal sinus tenderness. Left sinus exhibits maxillary sinus tenderness and frontal sinus tenderness.  No cervical lymphadenopathy   Cardiovascular: Normal rate, regular rhythm and normal heart sounds.   Respiratory: Effort normal and breath sounds normal. No respiratory distress. She has no wheezes.  GI: Soft. Bowel sounds are normal. She exhibits no distension and no mass. There is no tenderness. There is no rebound and no guarding.  Neurological: She is alert and  oriented to person, place, and time.  Skin: Skin is warm and dry. No erythema.  Psychiatric: She has a normal mood and affect.  Dilation: Closed Effacement (%): Thick Cervical Position: Posterior Exam by:: Naaman Plummer, PA  Results for orders placed during the hospital encounter of 01/25/13 (from the past 24 hour(s))  URINALYSIS, ROUTINE W REFLEX MICROSCOPIC     Status: None   Collection Time    01/25/13  9:15 AM      Result Value Range   Color, Urine YELLOW  YELLOW   APPearance CLEAR  CLEAR   Specific Gravity, Urine 1.015  1.005 - 1.030   pH 6.0  5.0 - 8.0   Glucose, UA NEGATIVE  NEGATIVE mg/dL   Hgb urine dipstick NEGATIVE  NEGATIVE   Bilirubin Urine NEGATIVE  NEGATIVE   Ketones, ur NEGATIVE  NEGATIVE mg/dL   Protein, ur NEGATIVE  NEGATIVE mg/dL   Urobilinogen, UA 0.2  0.0 - 1.0 mg/dL   Nitrite NEGATIVE  NEGATIVE   Leukocytes, UA NEGATIVE  NEGATIVE   Fetal Monitoring: Baseline: 140 bpm, moderate variability, + accelerations, no decelerations Contractions: none  MAU Course  Procedures None  MDM Discussed patient with Dr. Ellyn Hack. Check cervix, most likely round ligament pain. Recommend abdominal binder and warm baths for relief. May take Flexeril for pain.   Assessment and Plan  A: Round ligament pain Sinusitis, acute bacterial  P: Discharge home Rx for Augmentin sent to patient's pharmacy Recommended warm baths and abdominal binder for relief. Patient has Rx for Flexeril and Dilaudid for pain previously prescribed.  Patient should keep scheduled follow-up with [facility] as scheduled Patient may return to MAU as needed or if her condition should change or worsen  Freddi Starr, PA-C  01/25/2013, 9:49 AM

## 2013-01-25 NOTE — MAU Note (Signed)
Patient presents to MAU with c/o of intermittent abdominal cramping and lower abdominal pressure since 0200 today.  Was seen at doctor's office Tuesday and began taking Sudafed for sinus problems and was advised to call if febrile; Temp was 100.5 at 0630 and started cramping this morning. Called into office and was told to come in for evaluation.  Patient took Sudafed at 0530 today; denies taking any other medications today.  Reports good fetal movement; denies vaginal bleeding or LOF.

## 2013-03-06 ENCOUNTER — Inpatient Hospital Stay (HOSPITAL_COMMUNITY)
Admission: AD | Admit: 2013-03-06 | Discharge: 2013-03-06 | Disposition: A | Discharge status: Home / Self Care | Payer: Medicaid Other | Source: Ambulatory Visit | Attending: Obstetrics and Gynecology | Admitting: Obstetrics and Gynecology

## 2013-03-06 ENCOUNTER — Telehealth (HOSPITAL_COMMUNITY): Payer: Self-pay | Admitting: *Deleted

## 2013-03-06 ENCOUNTER — Encounter (HOSPITAL_COMMUNITY): Payer: Self-pay | Admitting: *Deleted

## 2013-03-06 DIAGNOSIS — O479 False labor, unspecified: Secondary | ICD-10-CM | POA: Insufficient documentation

## 2013-03-06 MED ORDER — OXYCODONE-ACETAMINOPHEN 5-325 MG PO TABS
1.0000 | ORAL_TABLET | Freq: Once | ORAL | Status: AC
  Administered 2013-03-06: 1 via ORAL
  Filled 2013-03-06: qty 1

## 2013-03-06 NOTE — Telephone Encounter (Signed)
Preadmission screen  

## 2013-03-06 NOTE — MAU Note (Signed)
Pt presents with complaint of contractions q 5 minutes. 

## 2013-03-07 ENCOUNTER — Inpatient Hospital Stay (HOSPITAL_COMMUNITY)
Admission: AD | Admit: 2013-03-07 | Discharge: 2013-03-10 | DRG: 775 | Disposition: A | Discharge status: Home / Self Care | Payer: Medicaid Other | Source: Ambulatory Visit | Attending: Obstetrics and Gynecology | Admitting: Obstetrics and Gynecology

## 2013-03-07 ENCOUNTER — Encounter (HOSPITAL_COMMUNITY): Payer: Self-pay | Admitting: Obstetrics

## 2013-03-07 DIAGNOSIS — N200 Calculus of kidney: Secondary | ICD-10-CM | POA: Diagnosis present

## 2013-03-07 DIAGNOSIS — O99344 Other mental disorders complicating childbirth: Secondary | ICD-10-CM | POA: Diagnosis present

## 2013-03-07 DIAGNOSIS — O26839 Pregnancy related renal disease, unspecified trimester: Secondary | ICD-10-CM | POA: Diagnosis present

## 2013-03-07 DIAGNOSIS — F3189 Other bipolar disorder: Secondary | ICD-10-CM | POA: Diagnosis present

## 2013-03-07 LAB — CBC
HCT: 37.9 % (ref 36.0–46.0)
Hemoglobin: 12.4 g/dL (ref 12.0–15.0)
MCV: 83.8 fL (ref 78.0–100.0)
RBC: 4.52 MIL/uL (ref 3.87–5.11)
WBC: 13.6 10*3/uL — ABNORMAL HIGH (ref 4.0–10.5)

## 2013-03-07 MED ORDER — LACTATED RINGERS IV SOLN: 500.0000 mL | INTRAVENOUS | Status: DC | PRN

## 2013-03-07 MED ORDER — OXYTOCIN BOLUS FROM INFUSION
500.0000 mL | INTRAVENOUS | Status: DC
  Administered 2013-03-08: 500 mL via INTRAVENOUS

## 2013-03-07 MED ORDER — LIDOCAINE HCL (PF) 1 % IJ SOLN
30.0000 mL | INTRAMUSCULAR | Status: AC | PRN
  Administered 2013-03-08: 30 mL via SUBCUTANEOUS
  Filled 2013-03-07 (×2): qty 30

## 2013-03-07 MED ORDER — IBUPROFEN 600 MG PO TABS
600.0000 mg | ORAL_TABLET | Freq: Four times a day (QID) | ORAL | Status: DC | PRN
  Administered 2013-03-08: 600 mg via ORAL
  Filled 2013-03-07: qty 1

## 2013-03-07 MED ORDER — CITRIC ACID-SODIUM CITRATE 334-500 MG/5ML PO SOLN: 30.0000 mL | ORAL | Status: DC | PRN

## 2013-03-07 MED ORDER — OXYTOCIN 40 UNITS IN LACTATED RINGERS INFUSION - SIMPLE MED
62.5000 mL/h | INTRAVENOUS | Status: DC
  Filled 2013-03-07: qty 1000

## 2013-03-07 MED ORDER — OXYCODONE-ACETAMINOPHEN 5-325 MG PO TABS: 1.0000 | ORAL_TABLET | ORAL | Status: DC | PRN

## 2013-03-07 MED ORDER — BUTORPHANOL TARTRATE 1 MG/ML IJ SOLN
1.0000 mg | INTRAMUSCULAR | Status: DC | PRN
  Administered 2013-03-08: 1 mg via INTRAVENOUS
  Filled 2013-03-07: qty 1

## 2013-03-07 MED ORDER — ONDANSETRON HCL 4 MG/2ML IJ SOLN: 4.0000 mg | Freq: Four times a day (QID) | INTRAMUSCULAR | Status: DC | PRN

## 2013-03-07 MED ORDER — FLEET ENEMA 7-19 GM/118ML RE ENEM: 1.0000 | ENEMA | Freq: Once | RECTAL | Status: DC

## 2013-03-07 MED ORDER — ACETAMINOPHEN 325 MG PO TABS: 650.0000 mg | ORAL_TABLET | ORAL | Status: DC | PRN

## 2013-03-07 MED ORDER — LACTATED RINGERS IV SOLN
INTRAVENOUS | Status: DC
  Administered 2013-03-07 – 2013-03-08 (×2): via INTRAVENOUS

## 2013-03-07 NOTE — MAU Note (Signed)
Pt states her water broke at 1913. Pt states she noted clear fluid 1 gush and trickling since that time

## 2013-03-07 NOTE — H&P (Addendum)
Meghan [name] is a 22 y.o. female G1P0 at 38+ weeks (EDD 03/16/13 by 9 week [place]) presenting for c/o SROM earlier this evening at about 7pm.  Pt having some mild/moderate contractions on arrival and cervix 80/2 per RN. Prenatal care complicated by nephrolithiasis with pt requiring time out of school and po narcotic paiin medications, better lately.  She also has bipolar/anxiety but has been stable.   Maternal Medical History:  Reason for admission: Rupture of membranes.   Contractions: Onset was 3-5 hours ago.   Frequency: regular.   Perceived severity is mild.    Prenatal Complications - Diabetes: none.    OB History   Grav Para Term Preterm Abortions TAB SAB Ect Mult Living   1 0 0 0 0 0 0 0 0 0      Past Medical History  Diagnosis Date  . Anxiety   . Depression   . Headache   . Obesity (BMI 30-39.9)     Pt  doesn't  "eat much"  does not eat veggies/fruit  . ADHD (attention deficit hyperactivity disorder)   . Chronic kidney disease     kidney stones  . Asthma    Past Surgical History  Procedure Laterality Date  . Wisdom tooth extraction     Family History: family history includes Anxiety disorder in her mother; Bipolar disorder in her maternal grandmother, mother, and other; Depression in her cousin; and Hypertension in her maternal grandfather, maternal grandmother, and mother. Social History:  reports that she has never smoked. She has never used smokeless tobacco. She reports that she does not drink alcohol or use illicit drugs.   Prenatal Transfer Tool  Maternal Diabetes: No Genetic Screening: Normal Maternal Ultrasounds/Referrals: Normal Fetal Ultrasounds or other Referrals:  None Maternal Substance Abuse:  No Significant Maternal Medications:  None Significant Maternal Lab Results:  None Other Comments:  None  ROS  Cervix 70/3-4/-2  Blood pressure 128/75, pulse 90, temperature 98.2 F (36.8 C), temperature source Oral, resp. rate 18, height 4\' 11"  (1.499 m),  weight 102.513 kg (226 lb). Maternal Exam:  Uterine Assessment: Contraction strength is mild.  Contraction frequency is regular.   Abdomen: Patient reports no abdominal tenderness. Fetal presentation: vertex  Introitus: Normal vulva. Normal vagina.    Physical Exam  Constitutional: She is oriented to person, place, and time. She appears well-developed and well-nourished.  Cardiovascular: Normal rate and regular rhythm.   Respiratory: Effort normal and breath sounds normal.  GI: Soft. Bowel sounds are normal.  Genitourinary: Vagina normal.  Uterus gravid  Musculoskeletal: Normal range of motion.  Neurological: She is alert and oriented to person, place, and time.  Psychiatric: She has a normal mood and affect. Her behavior is normal.    Prenatal labs: ABO, Rh: A/Positive/-- (10/15 0000) Antibody: Negative (10/15 0000) Rubella: Immune (10/15 0000) RPR: Nonreactive (10/15 0000)  HBsAg: Negative (10/15 0000)  HIV: Non-reactive (10/15 0000)  GBS: Negative (04/29 0000)  First trimester screen and AFP WNL One hour GTT 135   Assessment/Plan: Pt admitted with SROM and mild contractions, will augment labor with pitocin. Pt is trying to avoid epidural, but open to pitocin and monitoring.  Oliver Pila 03/07/2013, 11:46 PM

## 2013-03-08 ENCOUNTER — Inpatient Hospital Stay (HOSPITAL_COMMUNITY): Payer: Medicaid Other | Admitting: Anesthesiology

## 2013-03-08 ENCOUNTER — Encounter (HOSPITAL_COMMUNITY): Payer: Self-pay | Admitting: Anesthesiology

## 2013-03-08 LAB — ABO/RH: ABO/RH(D): A POS

## 2013-03-08 MED ORDER — OXYTOCIN 40 UNITS IN LACTATED RINGERS INFUSION - SIMPLE MED
1.0000 m[IU]/min | INTRAVENOUS | Status: DC
  Administered 2013-03-08: 1 m[IU]/min via INTRAVENOUS

## 2013-03-08 MED ORDER — LIDOCAINE-EPINEPHRINE (PF) 2 %-1:200000 IJ SOLN
INTRAMUSCULAR | Status: AC
  Filled 2013-03-08: qty 20

## 2013-03-08 MED ORDER — IBUPROFEN 600 MG PO TABS
600.0000 mg | ORAL_TABLET | Freq: Four times a day (QID) | ORAL | Status: DC
  Administered 2013-03-08 – 2013-03-10 (×9): 600 mg via ORAL
  Filled 2013-03-08 (×9): qty 1

## 2013-03-08 MED ORDER — DIBUCAINE 1 % RE OINT: 1.0000 "application " | TOPICAL_OINTMENT | RECTAL | Status: DC | PRN

## 2013-03-08 MED ORDER — EPHEDRINE 5 MG/ML INJ: 10.0000 mg | INTRAVENOUS | Status: DC | PRN

## 2013-03-08 MED ORDER — FENTANYL 2.5 MCG/ML BUPIVACAINE 1/10 % EPIDURAL INFUSION (WH - ANES)
14.0000 mL/h | INTRAMUSCULAR | Status: DC | PRN
  Filled 2013-03-08: qty 125

## 2013-03-08 MED ORDER — CALCIUM CARBONATE ANTACID 500 MG PO CHEW
400.0000 mg | CHEWABLE_TABLET | Freq: Two times a day (BID) | ORAL | Status: DC
  Administered 2013-03-08: 400 mg via ORAL
  Filled 2013-03-08: qty 2

## 2013-03-08 MED ORDER — TETANUS-DIPHTH-ACELL PERTUSSIS 5-2.5-18.5 LF-MCG/0.5 IM SUSP: 0.5000 mL | Freq: Once | INTRAMUSCULAR | Status: DC

## 2013-03-08 MED ORDER — ONDANSETRON HCL 4 MG PO TABS: 4.0000 mg | ORAL_TABLET | ORAL | Status: DC | PRN

## 2013-03-08 MED ORDER — PRENATAL MULTIVITAMIN CH
1.0000 | ORAL_TABLET | Freq: Every day | ORAL | Status: DC
  Administered 2013-03-08 – 2013-03-10 (×3): 1 via ORAL
  Filled 2013-03-08 (×4): qty 1

## 2013-03-08 MED ORDER — SENNOSIDES-DOCUSATE SODIUM 8.6-50 MG PO TABS
2.0000 | ORAL_TABLET | Freq: Every day | ORAL | Status: DC
  Administered 2013-03-08: 2 via ORAL

## 2013-03-08 MED ORDER — SODIUM BICARBONATE 8.4 % IV SOLN
INTRAVENOUS | Status: AC
  Filled 2013-03-08: qty 50

## 2013-03-08 MED ORDER — WITCH HAZEL-GLYCERIN EX PADS: 1.0000 "application " | MEDICATED_PAD | CUTANEOUS | Status: DC | PRN

## 2013-03-08 MED ORDER — SODIUM BICARBONATE 8.4 % IV SOLN
INTRAVENOUS | Status: DC | PRN
  Administered 2013-03-08: 5 mL via EPIDURAL

## 2013-03-08 MED ORDER — EPHEDRINE 5 MG/ML INJ
10.0000 mg | INTRAVENOUS | Status: DC | PRN
  Filled 2013-03-08: qty 4

## 2013-03-08 MED ORDER — DIPHENHYDRAMINE HCL 50 MG/ML IJ SOLN: 12.5000 mg | INTRAMUSCULAR | Status: DC | PRN

## 2013-03-08 MED ORDER — ZOLPIDEM TARTRATE 5 MG PO TABS: 5.0000 mg | ORAL_TABLET | Freq: Every evening | ORAL | Status: DC | PRN

## 2013-03-08 MED ORDER — FENTANYL 2.5 MCG/ML BUPIVACAINE 1/10 % EPIDURAL INFUSION (WH - ANES)
INTRAMUSCULAR | Status: DC | PRN
  Administered 2013-03-08: 12 mL/h via EPIDURAL

## 2013-03-08 MED ORDER — BENZOCAINE-MENTHOL 20-0.5 % EX AERO
1.0000 "application " | INHALATION_SPRAY | CUTANEOUS | Status: DC | PRN
  Filled 2013-03-08: qty 56

## 2013-03-08 MED ORDER — TERBUTALINE SULFATE 1 MG/ML IJ SOLN: 0.2500 mg | Freq: Once | INTRAMUSCULAR | Status: DC | PRN

## 2013-03-08 MED ORDER — PHENYLEPHRINE 40 MCG/ML (10ML) SYRINGE FOR IV PUSH (FOR BLOOD PRESSURE SUPPORT): 80.0000 ug | PREFILLED_SYRINGE | INTRAVENOUS | Status: DC | PRN

## 2013-03-08 MED ORDER — SODIUM BICARBONATE 8.4 % IV SOLN: INTRAVENOUS | Status: DC | PRN

## 2013-03-08 MED ORDER — PHENYLEPHRINE 40 MCG/ML (10ML) SYRINGE FOR IV PUSH (FOR BLOOD PRESSURE SUPPORT)
80.0000 ug | PREFILLED_SYRINGE | INTRAVENOUS | Status: DC | PRN
  Filled 2013-03-08: qty 5

## 2013-03-08 MED ORDER — LACTATED RINGERS IV SOLN
500.0000 mL | Freq: Once | INTRAVENOUS | Status: AC
  Administered 2013-03-08: 500 mL via INTRAVENOUS

## 2013-03-08 MED ORDER — OXYCODONE-ACETAMINOPHEN 5-325 MG PO TABS
1.0000 | ORAL_TABLET | ORAL | Status: DC | PRN
  Administered 2013-03-10: 1 via ORAL
  Filled 2013-03-08: qty 1

## 2013-03-08 MED ORDER — DIPHENHYDRAMINE HCL 25 MG PO CAPS: 25.0000 mg | ORAL_CAPSULE | Freq: Four times a day (QID) | ORAL | Status: DC | PRN

## 2013-03-08 MED ORDER — LANOLIN HYDROUS EX OINT: TOPICAL_OINTMENT | CUTANEOUS | Status: DC | PRN

## 2013-03-08 MED ORDER — ONDANSETRON HCL 4 MG/2ML IJ SOLN: 4.0000 mg | INTRAMUSCULAR | Status: DC | PRN

## 2013-03-08 MED ORDER — SIMETHICONE 80 MG PO CHEW: 80.0000 mg | CHEWABLE_TABLET | ORAL | Status: DC | PRN

## 2013-03-08 MED ORDER — LIDOCAINE HCL (PF) 1 % IJ SOLN
INTRAMUSCULAR | Status: DC | PRN
  Administered 2013-03-08: 4 mL
  Administered 2013-03-08: 3 mL

## 2013-03-08 NOTE — Anesthesia Procedure Notes (Signed)
Epidural Patient location during procedure: OB Start time: 03/08/2013 3:31 AM  Staffing Anesthesiologist: Chandria Rookstool A. Performed by: anesthesiologist   Preanesthetic Checklist Completed: patient identified, site marked, surgical consent, pre-op evaluation, timeout performed, IV checked, risks and benefits discussed and monitors and equipment checked  Epidural Patient position: sitting Prep: site prepped and draped and DuraPrep Patient monitoring: continuous pulse ox and blood pressure Approach: midline Injection technique: LOR air  Needle:  Needle type: Tuohy  Needle gauge: 17 G Needle length: 9 cm and 9 Needle insertion depth: 9 cm Catheter type: closed end flexible Catheter size: 19 Gauge Catheter at skin depth: 15 cm Test dose: negative and Other  Assessment Events: blood not aspirated, injection not painful, no injection resistance, negative IV test and no paresthesia  Additional Notes Patient identified. Risks and benefits discussed including failed block, incomplete  Pain control, post dural puncture headache, nerve damage, paralysis, blood pressure Changes, nausea, vomiting, reactions to medications-both toxic and allergic and post Partum back pain. All questions were answered. Patient expressed understanding and wished to proceed. Sterile technique was used throughout procedure. Epidural site was Dressed with sterile barrier dressing. No paresthesias, signs of intravascular injection Or signs of intrathecal spread were encountered.  Patient was more comfortable after the epidural was dosed. Please see RN's note for documentation of vital signs and FHR which are stable.

## 2013-03-08 NOTE — Anesthesia Postprocedure Evaluation (Signed)
  Anesthesia Post-op Note  Patient: Meghan [name]  Procedure(s) Performed: * No procedures listed *  Patient Location: PACU and Mother/Baby  Anesthesia Type:Epidural  Level of Consciousness: awake, alert  and oriented  Airway and Oxygen Therapy: Patient Spontanous Breathing  Post-op Pain: mild  Post-op Assessment: Patient's Cardiovascular Status Stable, Respiratory Function Stable, No signs of Nausea or vomiting, Adequate PO intake, Pain level controlled, No headache, No backache and No residual numbness  Post-op Vital Signs: stable  Complications: No apparent anesthesia complications

## 2013-03-08 NOTE — Anesthesia Preprocedure Evaluation (Addendum)
Anesthesia Evaluation  Patient identified by MRN, date of birth, ID band Patient awake    Reviewed: Allergy & Precautions, H&P , Patient's Chart, lab work & pertinent test results  Airway Mallampati: III TM Distance: >3 FB Neck ROM: full    Dental no notable dental hx. (+) Teeth Intact   Pulmonary neg pulmonary ROS, asthma ,  breath sounds clear to auscultation  Pulmonary exam normal       Cardiovascular negative cardio ROS  Rhythm:regular Rate:Normal     Neuro/Psych  Headaches, PSYCHIATRIC DISORDERS Anxiety Depression negative neurological ROS  negative psych ROS   GI/Hepatic negative GI ROS, Neg liver ROS,   Endo/Other  Morbid obesity  Renal/GU Renal diseasenegative Renal ROS  negative genitourinary   Musculoskeletal negative musculoskeletal ROS (+)   Abdominal Normal abdominal exam  (+)   Peds  Hematology negative hematology ROS (+)   Anesthesia Other Findings   Reproductive/Obstetrics (+) Pregnancy                           Anesthesia Physical Anesthesia Plan  ASA: III and emergent  Anesthesia Plan: Epidural   Post-op Pain Management:    Induction:   Airway Management Planned:   Additional Equipment:   Intra-op Plan:   Post-operative Plan:   Informed Consent: I have reviewed the patients History and Physical, chart, labs and discussed the procedure including the risks, benefits and alternatives for the proposed anesthesia with the patient or authorized representative who has indicated his/her understanding and acceptance.     Plan Discussed with: Anesthesiologist  Anesthesia Plan Comments:        Anesthesia Quick Evaluation

## 2013-03-08 NOTE — Progress Notes (Signed)
UR chart review completed.  

## 2013-03-09 ENCOUNTER — Inpatient Hospital Stay (HOSPITAL_COMMUNITY): Admission: RE | Admit: 2013-03-09 | Payer: Medicaid Other | Source: Ambulatory Visit

## 2013-03-09 LAB — CBC
Platelets: 162 10*3/uL (ref 150–400)
RDW: 15.8 % — ABNORMAL HIGH (ref 11.5–15.5)
WBC: 10.8 10*3/uL — ABNORMAL HIGH (ref 4.0–10.5)

## 2013-03-09 NOTE — Progress Notes (Signed)
PPD #1 No problems Afeb, VSS Fundus firm, NT at U-1 Continue routine postpartum care 

## 2013-03-10 MED ORDER — OXYCODONE-ACETAMINOPHEN 5-325 MG PO TABS: 1.0000 | ORAL_TABLET | ORAL | Status: DC | PRN

## 2013-03-10 MED ORDER — IBUPROFEN 600 MG PO TABS: 600.0000 mg | ORAL_TABLET | Freq: Four times a day (QID) | ORAL | Status: DC

## 2013-03-10 NOTE — Discharge Summary (Signed)
Obstetric Discharge Summary Reason for Admission: rupture of membranes Prenatal Procedures: none Intrapartum Procedures: spontaneous vaginal delivery Postpartum Procedures: none Complications-Operative and Postpartum: 2nd degree perineal laceration Hemoglobin  Date Value Range Status  03/09/2013 10.4* 12.0 - 15.0 g/dL Final     HCT  Date Value Range Status  03/09/2013 32.3* 36.0 - 46.0 % Final    Physical Exam:  General: alert Lochia: appropriate Uterine Fundus: firm  Discharge Diagnoses: Term Pregnancy-delivered  Discharge Information: Date: 03/10/2013 Activity: pelvic rest Diet: routine Medications: Ibuprofen and Percocet Condition: stable Instructions: refer to practice specific booklet Discharge to: home Follow-up Information   Follow up with Meghan Pila, MD. Schedule an appointment as soon as possible for a visit in 6 weeks. (postpartum check)    Contact information:   [address], [address] [place] [address] [phone]       Newborn Data: Live born female  Birth Weight: 5 lb 15.1 oz (2695 g) APGAR: 8, 9  Home with mother.  Meghan [name] D 03/10/2013, 9:51 AM

## 2013-03-10 NOTE — Progress Notes (Signed)
PPD #2 Doing ok Afeb, VSS Fundus firm D/c home 

## 2013-06-27 DIAGNOSIS — G43909 Migraine, unspecified, not intractable, without status migrainosus: Secondary | ICD-10-CM | POA: Insufficient documentation

## 2013-11-08 HISTORY — PX: CHOLECYSTECTOMY: SHX55

## 2014-09-09 ENCOUNTER — Encounter (HOSPITAL_COMMUNITY): Payer: Self-pay | Admitting: Anesthesiology

## 2014-11-08 HISTORY — PX: TONSILLECTOMY: SUR1361

## 2015-02-19 ENCOUNTER — Ambulatory Visit (HOSPITAL_COMMUNITY): Payer: Self-pay | Admitting: Physician Assistant

## 2015-03-06 ENCOUNTER — Ambulatory Visit (INDEPENDENT_AMBULATORY_CARE_PROVIDER_SITE_OTHER): Payer: Federal, State, Local not specified - Other | Admitting: Physician Assistant

## 2015-03-06 ENCOUNTER — Encounter (HOSPITAL_COMMUNITY): Payer: Self-pay | Admitting: Physician Assistant

## 2015-03-06 VITALS — BP 126/68 | HR 74 | Ht 61.0 in | Wt 206.0 lb

## 2015-03-06 DIAGNOSIS — F489 Nonpsychotic mental disorder, unspecified: Secondary | ICD-10-CM

## 2015-03-06 DIAGNOSIS — F411 Generalized anxiety disorder: Secondary | ICD-10-CM

## 2015-03-06 DIAGNOSIS — F4310 Post-traumatic stress disorder, unspecified: Secondary | ICD-10-CM | POA: Diagnosis not present

## 2015-03-06 DIAGNOSIS — F5105 Insomnia due to other mental disorder: Secondary | ICD-10-CM

## 2015-03-06 DIAGNOSIS — F331 Major depressive disorder, recurrent, moderate: Secondary | ICD-10-CM | POA: Diagnosis not present

## 2015-03-06 HISTORY — DX: Post-traumatic stress disorder, unspecified: F43.10

## 2015-03-06 MED ORDER — FLUOXETINE HCL 10 MG PO CAPS: 10.0000 mg | ORAL_CAPSULE | Freq: Every day | ORAL | Status: DC

## 2015-03-06 MED ORDER — TRAZODONE HCL 50 MG PO TABS: ORAL_TABLET | ORAL | Status: DC

## 2015-03-06 MED ORDER — HYDROXYZINE HCL 10 MG PO TABS
10.0000 mg | ORAL_TABLET | Freq: Three times a day (TID) | ORAL | Status: DC | PRN
Start: 2015-03-06 — End: 2015-03-21

## 2015-03-06 NOTE — Progress Notes (Deleted)
[facility] MD Progress Note  03/06/2015 9:47 AM Meghan [name]  MRN:  [id number] Subjective:   Principal Problem: MDD worsening Depression: Depression, crying, anhedonia, fatigue, poor sleep, decreased appetite, hopelessness,  guilt, not wanting to get out of bed each day and isolation, the patient reports these symptoms worsening patient was lost to follow up and has been off of her medication for two years, and has been delayed getting treatmentt due to change in MCD.  She continued to have mild symptoms after d/c her meds but noticed a significant increase in symptoms since July of last year.  Anxiety: anxiety, excessive worry, panic attacks, social anxiety, agoraphobia, agitation, irritability, mood swings, mood lability, chest tightness during panic attacks, sweating, gets angry, and just shuts down when she gets over stimulated. She also reports flashbacks, hypervigilant, sense of foreshortened life on occasion.  Denies symptoms of mania, no psychosis, delusions, or paranoia.   Patient Active Problem List   Diagnosis Date Noted  . NSVD (normal spontaneous vaginal delivery) [O80] 03/08/2013  . Hydronephrosis [N13.30] 12/05/2012  . MDD (major depressive disorder) [F32.2] 09/21/2011  . GAD (generalized anxiety disorder) [F41.1] 09/21/2011   Total Time spent with patient: {Time; 15 min - 8 hours:17441}   Past Medical History:  Past Medical History  Diagnosis Date  . Anxiety   . Depression   . Headache(784.0)   . Obesity (BMI 30-39.9)     Pt  doesn't  "eat much"  does not eat veggies/fruit  . ADHD (attention deficit hyperactivity disorder)   . Chronic kidney disease     kidney stones  . Asthma     Past Surgical History  Procedure Laterality Date  . Wisdom tooth extraction    . Cholecystectomy  2015  . Tonsillectomy  2016   Family History:  Family History  Problem Relation Age of Onset  . Bipolar disorder Mother   . Anxiety disorder Mother   . Hypertension Mother   . Bipolar  disorder Maternal Grandmother   . Hypertension Maternal Grandmother   . Depression Cousin   . Bipolar disorder Other   . Hypertension Maternal Grandfather    Social History:  History  Alcohol Use No     History  Drug Use No    History   Social History  . Marital Status: Single    Spouse Name: N/A  . Number of Children: N/A  . Years of Education: N/A   Social History Main Topics  . Smoking status: Never Smoker   . Smokeless tobacco: Never Used  . Alcohol Use: No  . Drug Use: No  . Sexual Activity:    Partners: Male    Birth Control/ Protection: Pill   Other Topics Concern  . None   Social History Narrative   Additional History:    Sleep: {BHH GOOD/FAIR/POOR:22877}  Appetite:  {BHH GOOD/FAIR/POOR:22877}   Assessment:   Musculoskeletal: Strength & Muscle Tone: {desc; muscle tone:32375} Gait & Station: {PE GAIT ED WUJW:11914} Patient leans: {Patient Leans:21022755}   Psychiatric Specialty Exam: Physical Exam  Review of Systems  Constitutional: Negative for fever, chills, weight loss, malaise/fatigue and diaphoresis.  HENT: Negative.   Eyes: Negative.   Respiratory: Negative.   Cardiovascular: Negative.   Gastrointestinal: Positive for heartburn. Negative for nausea, vomiting, abdominal pain, diarrhea and constipation.  Genitourinary: Positive for hematuria. Negative for dysuria, urgency, frequency and flank pain.  Musculoskeletal: Negative.   Skin: Negative.   Neurological: Negative for dizziness, tingling, tremors, speech change, focal weakness, seizures, loss of consciousness and  weakness.  Endo/Heme/Allergies: Negative.   Psychiatric/Behavioral: Positive for depression and memory loss. Negative for suicidal ideas, hallucinations and substance abuse. The patient is nervous/anxious and has insomnia.     Blood pressure 126/68, pulse 74, height 5\' 1"  (1.549 m), weight 206 lb (93.441 kg), unknown if currently breastfeeding.Body mass index is 38.94 kg/(m^2).   General Appearance: {Appearance:22683}  Eye Contact::  {BHH EYE CONTACT:22684}  Speech:  {Speech:22685}  Volume:  {Volume (PAA):22686}  Mood:  {BHH MOOD:22306}  Affect:  {Affect (PAA):22687}  Thought Process:  {Thought Process (PAA):22688}  Orientation:  {BHH ORIENTATION (PAA):22689}  Thought Content:  {Thought Content:22690}  Suicidal Thoughts:  {ST/HT (PAA):22692}  Homicidal Thoughts:  {ST/HT (PAA):22692}  Memory:  {BHH MEMORY:22881}  Judgement:  {Judgement (PAA):22694}  Insight:  {Insight (PAA):22695}  Psychomotor Activity:  {Psychomotor (PAA):22696}  Concentration:  {BHH GOOD/FAIR/POOR:22877}  Recall:  {BHH GOOD/FAIR/POOR:22877}  Fund of Knowledge:{BHH GOOD/FAIR/POOR:22877}  Language: {BHH GOOD/FAIR/POOR:22877}  Akathisia:  {BHH YES OR NO:22294}  Handed:  {Handed:22697}  AIMS (if indicated):     Assets:  {Assets (PAA):22698}  ADL's:  {BHH ZOX'W:96045}ADL'S:22290}  Cognition: {chl bhh cognition:304700322}  Sleep:        Current Medications: Current Outpatient Prescriptions  Medication Sig Dispense Refill  . calcium carbonate (TUMS - DOSED IN MG ELEMENTAL CALCIUM) 500 MG chewable tablet Chew 2 tablets by mouth as needed. For heartburn    . diphenhydrAMINE (BENADRYL) 12.5 MG/5ML elixir Take 12.5 mg by mouth as needed for allergies.    . fluconazole (DIFLUCAN) 150 MG tablet   0  . ibuprofen (ADVIL,MOTRIN) 600 MG tablet Take 1 tablet (600 mg total) by mouth every 6 (six) hours. 30 tablet 1  . predniSONE (DELTASONE) 20 MG tablet   0  . Prenatal Vit-Fe Fumarate-FA (PRENATAL MULTIVITAMIN) TABS Take 1 tablet by mouth at bedtime.     No current facility-administered medications for this visit.    Lab Results: No results found for this or any previous visit (from the past 48 hour(s)).  Physical Findings: AIMS:  , ,  ,  ,    CIWA:    COWS:     Treatment Plan Summary: {[facility] [place]. WUJW:[id number]}PLAN:304700250}   Medical Decision Making:  {bh medical decision  making:21022756}     Meghan [name] 03/06/2015, 9:47 AM
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
## 2015-03-06 NOTE — Patient Instructions (Signed)
1. Take all of your medications as discussed with your provider. (Please check your AVS, for the list.) 2. Call this office for any questions or problems. 3. Be sure to get plenty of rest and try for 7-9 hours of quality sleep each night. 4. Try to get regular exercise, at least 15-30 minutes each day.  A good walk will help tremendously! 5. Remember to do your mindfulness each day, breath deeply in and out, while having quiet reflection, prayer, meditation, or positive visualization. Unplug and turn off all electronic devices each day for your own personal time without interruption. This works! There are studies to back this up! 6. Be sure to take your B complex and Vitamin D3 each day. This will improve your overall wellbeing and boost your immune system as well. 7. Try to eat a nutritious healthy diet and avoid excessive alcohol and ALL tobacco products. 8. Be sure to keep all of your appointments with your outpatient therapist. If you do not have one, our office will be happy to assist you with this. 9. Be sure to keep your next follow up appointment in 2 weeks. 

## 2015-03-21 ENCOUNTER — Ambulatory Visit (INDEPENDENT_AMBULATORY_CARE_PROVIDER_SITE_OTHER): Payer: Federal, State, Local not specified - Other | Admitting: Physician Assistant

## 2015-03-21 ENCOUNTER — Encounter (HOSPITAL_COMMUNITY): Payer: Self-pay | Admitting: Physician Assistant

## 2015-03-21 VITALS — BP 116/68 | HR 98 | Ht 61.0 in | Wt 206.0 lb

## 2015-03-21 DIAGNOSIS — F331 Major depressive disorder, recurrent, moderate: Secondary | ICD-10-CM | POA: Diagnosis not present

## 2015-03-21 DIAGNOSIS — F5105 Insomnia due to other mental disorder: Secondary | ICD-10-CM | POA: Diagnosis not present

## 2015-03-21 DIAGNOSIS — F411 Generalized anxiety disorder: Secondary | ICD-10-CM | POA: Diagnosis not present

## 2015-03-21 DIAGNOSIS — F4310 Post-traumatic stress disorder, unspecified: Secondary | ICD-10-CM

## 2015-03-21 MED ORDER — FLUOXETINE HCL 10 MG PO CAPS: 10.0000 mg | ORAL_CAPSULE | Freq: Every day | ORAL | Status: DC

## 2015-03-21 MED ORDER — HYDROXYZINE HCL 25 MG PO TABS: 25.0000 mg | ORAL_TABLET | Freq: Three times a day (TID) | ORAL | Status: DC | PRN

## 2015-03-21 MED ORDER — TRAZODONE HCL 50 MG PO TABS: ORAL_TABLET | ORAL | Status: DC

## 2015-03-21 NOTE — Progress Notes (Signed)
[facility]  03/21/2015 11:15 AM Tivis RingerLakin J Bell  MRN:  [id number] Subjective:  Meghan [name] is in today to follow up on her PTSD, MDD, and GAD. She feels that she is better as she feels more like her old self, in terms of her depression she  doesn't feel like crying as much, wants to get out of bed now, has more energy, is sleeping well, and is less isolative. In terms of her anxiety, she notes that her panic is better, her anxiety level feels less intense, she notes a decrease in panic attacks in frequency, less palpitations, sweats, or increasing sense of doom, although she does have a fear of thunderstorms which escalate her anxiety. We have had several lately Principal Problem: MDD, GAD, PTSD  Diagnosis:   Patient Active Problem List   Diagnosis Date Noted  . Post traumatic stress disorder [F43.10] 03/06/2015  . Headache, migraine [G43.909] 06/27/2013  . NSVD (normal spontaneous vaginal delivery) [O80] 03/08/2013  . Hydronephrosis [N13.30] 12/05/2012  . Family planning [Z30.09] 06/30/2012  . MDD (major depressive disorder) [F32.2] 09/21/2011  . GAD (generalized anxiety disorder) [F41.1] 09/21/2011  . 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 Anxiety   . Depression   . Headache(784.0)   . Obesity (BMI 30-39.9)     Pt  doesn't  "eat much"  does not eat veggies/fruit  . ADHD (attention deficit hyperactivity disorder)   . Chronic kidney disease     kidney stones  . Asthma   . Post traumatic stress disorder 03/06/2015    Past Surgical History  Procedure Laterality Date  . Wisdom tooth extraction    . Cholecystectomy  2015  . Tonsillectomy  2016   Family History:  Family History  Problem Relation Age of Onset  . Bipolar disorder Mother   . Anxiety disorder Mother   . Hypertension Mother   . Bipolar disorder Maternal Grandmother   . Hypertension Maternal Grandmother   . Depression Cousin   . Bipolar  disorder Other   . Hypertension Maternal Grandfather    Social History:  History  Alcohol Use No     History  Drug Use No    History   Social History  . Marital Status: Single    Spouse Name: N/A  . Number of Children: N/A  . Years of Education: N/A   Social History Main Topics  . Smoking status: Never Smoker   . Smokeless tobacco: Never Used  . Alcohol Use: No  . Drug Use: No  . Sexual Activity:    Partners: Male    Birth Control/ Protection: Pill   Other Topics Concern  . None   Social History Narrative   Additional History:    Sleep: Good  Appetite:  Fair   Assessment:   Musculoskeletal: Strength & Muscle Tone: within normal limits Gait & Station: normal Patient leans: normal   Psychiatric Specialty Exam: Physical Exam  ROS  Blood pressure 116/68, pulse 98, height 5\' 1"  (1.549 m), weight 206 lb (93.441 kg), SpO2 99 %, unknown if currently breastfeeding.Body mass index is 38.94 kg/(m^2).  General Appearance: Well Groomed  Patent attorneyye Contact::  Good  Speech:  Clear and Coherent and Pressured  Volume:  Normal  Mood:  Anxious and Depressed  Affect:  Congruent  Thought Process:  Coherent, Goal Directed, Intact, Linear and Logical  Orientation:  Full (Time, Place, and Person)  Thought Content:  WDL  Suicidal  Thoughts:  No  Homicidal Thoughts:  No  Memory:  Immediate;   Good Recent;   Good Remote;   Good  Judgement:  Good  Insight:  Good  Psychomotor Activity:  Normal  Concentration:  Fair  Recall:  Good  Fund of Knowledge:Good  Language: Good  Akathisia:  No  Handed:  Right  AIMS (if indicated):     Assets:  Communication Skills Desire for Improvement Financial Resources/Insurance Housing Leisure Time Physical Health Resilience Social Support Talents/Skills Transportation Vocational/Educational  ADL's:  Intact  Cognition: WNL  Sleep:        Current Medications: Current Outpatient Prescriptions  Medication Sig Dispense Refill  .  albuterol (PROVENTIL HFA;VENTOLIN HFA) 108 (90 BASE) MCG/ACT inhaler Inhale 2 puffs into the lungs.    . calcium carbonate (TUMS - DOSED IN MG ELEMENTAL CALCIUM) 500 MG chewable tablet Chew 2 tablets by mouth as needed. For heartburn    . Calcium-Magnesium-Vitamin D [id number] MG-MG-UNIT TABS Take by mouth.    . fluconazole (DIFLUCAN) 150 MG tablet   0  . fluconazole (DIFLUCAN) 150 MG tablet Take 1 tablet once for yeast infection; may repeat once in 3 days if needed    . FLUoxetine (PROZAC) 10 MG capsule Take 1 capsule (10 mg total) by mouth daily. 30 capsule 0  . hydrOXYzine (ATARAX/VISTARIL) 25 MG tablet   0  . ibuprofen (ADVIL,MOTRIN) 600 MG tablet Take 1 tablet (600 mg total) by mouth every 6 (six) hours. 30 tablet 1  . mupirocin ointment (BACTROBAN) 2 % Apply to affected area 3 times daily    . norgestimate-ethinyl estradiol (ORTHO-CYCLEN,SPRINTEC,PREVIFEM) 0.25-35 MG-MCG tablet Take 1 tablet by mouth.    . Prenatal Vit-Fe Fumarate-FA (PRENATAL MULTIVITAMIN) TABS Take 1 tablet by mouth at bedtime.    . topiramate (TOPAMAX) 25 MG tablet Take 25 mg by mouth.     No current facility-administered medications for this visit.    Lab Results: No results found for this or any previous visit (from the past 48 hour(s)).  Physical Findings: AIMS: CIWA:   COWS:    Treatment Plan Summary: 1.PTSD improving Continue current medication as planned. 2. MDD-improving 3. GAD-Improving   Medical Decision Making:  Established Problem, Stable/Improving (1) and Review of Psycho-Social Stressors (1) Last labs reviewed from PE in March.  Follow up in 4 weeks.  Anice Wilshire 03/21/2015, 11:15 AM

## 2015-03-21 NOTE — Patient Instructions (Signed)
1. Take all of your medications as discussed with your provider. (Please check your AVS, for the list.) 2. Call this office for any questions or problems. 3. Be sure to get plenty of rest and try for 7-9 hours of quality sleep each night. 4. Try to get regular exercise, at least 15-30 minutes each day.  A good walk will help tremendously! 5. Remember to do your mindfulness each day, breath deeply in and out, while having quiet reflection, prayer, meditation, or positive visualization. Unplug and turn off all electronic devices each day for your own personal time without interruption. This works! There are studies to back this up! 6. Be sure to take your B complex and Vitamin D3 each day. This will improve your overall wellbeing and boost your immune system as well. 7. Try to eat a nutritious healthy diet and avoid excessive alcohol and ALL tobacco products. 8. Be sure to keep all of your appointments with your outpatient therapist. If you do not have one, our office will be happy to assist you with this. 9. Be sure to keep your next follow up appointment in 3-4 weeks. 
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
## 2015-04-10 ENCOUNTER — Ambulatory Visit (HOSPITAL_COMMUNITY): Payer: Federal, State, Local not specified - Other | Admitting: Licensed Clinical Social Worker

## 2015-04-18 ENCOUNTER — Ambulatory Visit (HOSPITAL_COMMUNITY): Payer: Federal, State, Local not specified - Other | Admitting: Physician Assistant

## 2015-04-21 ENCOUNTER — Ambulatory Visit: Payer: Self-pay | Admitting: Obstetrics

## 2015-05-07 ENCOUNTER — Ambulatory Visit (INDEPENDENT_AMBULATORY_CARE_PROVIDER_SITE_OTHER): Payer: Federal, State, Local not specified - Other | Admitting: Physician Assistant

## 2015-05-07 ENCOUNTER — Encounter (HOSPITAL_COMMUNITY): Payer: Self-pay | Admitting: Physician Assistant

## 2015-05-07 VITALS — BP 128/76 | HR 119 | Ht 59.0 in | Wt 204.0 lb

## 2015-05-07 DIAGNOSIS — F411 Generalized anxiety disorder: Secondary | ICD-10-CM

## 2015-05-07 DIAGNOSIS — F4310 Post-traumatic stress disorder, unspecified: Secondary | ICD-10-CM

## 2015-05-07 DIAGNOSIS — F331 Major depressive disorder, recurrent, moderate: Secondary | ICD-10-CM

## 2015-05-07 DIAGNOSIS — F5105 Insomnia due to other mental disorder: Secondary | ICD-10-CM

## 2015-05-07 DIAGNOSIS — F329 Major depressive disorder, single episode, unspecified: Secondary | ICD-10-CM

## 2015-05-07 MED ORDER — BUSPIRONE HCL 5 MG PO TABS: 5.0000 mg | ORAL_TABLET | Freq: Two times a day (BID) | ORAL | Status: DC

## 2015-05-07 MED ORDER — FLUOXETINE HCL 20 MG PO CAPS: 20.0000 mg | ORAL_CAPSULE | Freq: Every day | ORAL | Status: DC

## 2015-05-07 MED ORDER — HYDROXYZINE HCL 25 MG PO TABS: 25.0000 mg | ORAL_TABLET | Freq: Three times a day (TID) | ORAL | Status: DC | PRN
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
## 2015-05-07 NOTE — Patient Instructions (Signed)
1. Continue all medication as ordered. 2. Call this office if you have any questions or concerns. 3. Continue to get regular exercise 3-5 times a week. 4. Continue to eat a healthy nutritionally balanced diet. 5. Continue to reduce stress and anxiety through activities such as yoga, mindfulness, meditation and or prayer. 6. Keep all appointments with your out patient therapist and have notes forwarded to this office. (If you do not have one and would like to be scheduled with a therapist, please let our office assist you with this. 7. Follow up as planned 3-4 weeks.  Evidence based medicine supports that taking these two supplements will improve your overall mental health.  *Vitamin D3 (5000 i.u.) take one per day. *B complex take one per day.   Positive self talk.  [facility]. Look on line.

## 2015-06-18 ENCOUNTER — Ambulatory Visit (INDEPENDENT_AMBULATORY_CARE_PROVIDER_SITE_OTHER): Payer: Medicaid Other | Admitting: Obstetrics

## 2015-06-18 VITALS — BP 109/80 | HR 91 | Temp 98.4°F | Wt 206.0 lb

## 2015-06-18 DIAGNOSIS — Z3041 Encounter for surveillance of contraceptive pills: Secondary | ICD-10-CM

## 2015-06-18 DIAGNOSIS — R896 Abnormal cytological findings in specimens from other organs, systems and tissues: Secondary | ICD-10-CM | POA: Diagnosis not present

## 2015-06-18 DIAGNOSIS — IMO0002 Reserved for concepts with insufficient information to code with codable children: Secondary | ICD-10-CM

## 2015-06-18 NOTE — Patient Instructions (Signed)
Human Papillomavirus Human papillomavirus (HPV) is the most common sexually transmitted infection (STI) and is highly contagious. HPV infections cause genital warts and cancers to the outlet of the womb (cervix), birth canal (vagina), opening of the birth canal (vulva), and anus. There are over 100 types of HPV. Four types of HPV are responsible for causing 70% of all cervical cancers. Ninety percent of anal cancers and genital warts are caused by HPV. Unless you have wart-like lesions in the throat or genital warts that you can see or feel, HPV usually does not cause symptoms. Therefore, people can be infected for long periods and pass it on to others without knowing it. HPV in pregnancy usually does not cause a problem for the mother or baby. If the mother has genital warts, the baby rarely gets infected. When the HPV infection is found to be pre-cancerous on the cervix, vagina, or vulva, the mother will be followed closely during the pregnancy. Any needed treatment will be done after the baby is born. CAUSES   Having unprotected sex. HPV can be spread by oral, vaginal, or anal sexual activity.  Having several sex partners.  Having a sex partner who has other sex partners.  Having or having had another sexually transmitted infection. SYMPTOMS   More than 90% of people carrying HPV cannot tell anything is wrong.  Wart-like lesions in the throat (from having oral sex).  Warts in the infected skin or mucous membranes.  Genital warts may itch, burn, or bleed.  Genital warts may be painful or bleed during sexual intercourse. DIAGNOSIS   Genital warts are easily seen with the naked eye.  Currently, there is no FDA-approved test to detect HPV in males.  In females, a Pap test can show cells which are infected with HPV.  In females, a scope can be used to view the cervix (colposcopy). A colposcopy can be performed if the pelvic exam or Pap test is abnormal.  In females, a sample of tissue  may be removed (biopsy) during the colposcopy. TREATMENT   Treatment of genital warts can include:  Podophyllin lotion or gel.  Bichloroacetic acid (BCA) or trichloroacetic acid (TCA).  Podofilox solution or gel.  Imiquimod cream.  Interferon injections.  Use of a probe to apply extreme cold (cryotherapy).  Application of an intense beam of light (laser treatment).  Use of a probe to apply extreme heat (electrocautery).  Surgery.  HPV of the cervix, vagina, or vulva can be treated with:  Cryotherapy.  Laser treatment.  Electrocautery.  Surgery. Your caregiver will follow you closely after you are treated. This is because the HPV can come back and may need treatment again. HOME CARE INSTRUCTIONS   Follow your caregiver's instructions regarding medications, Pap tests, and follow-up exams.  Do not touch or scratch the warts.  Do not treat genital warts with medication used for treating hand warts.  Tell your sex partner about your infection because he or she may also need treatment.  Do not have sex while you are being treated.  After treatment, use condoms during sex to prevent future infections.  Have only 1 sex partner.  Have a sex partner who does not have other sex partners.  Use over-the-counter creams for itching or irritation as directed by your caregiver.  Use over-the-counter or prescription medicines for pain, discomfort, or fever as directed by your caregiver.  Do not douche or use tampons during treatment of HPV. PREVENTION   Talk to your caregiver about getting the HPV   vaccines. These vaccines prevent some HPV infections and cancers. It is recommended that the vaccine be given to males and females between the age of 84 and 61 years old. It will not work if you already have HPV and it is not recommended for pregnant women. The vaccines are not recommended for pregnant women.  Call your caregiver if you think you are pregnant and have the HPV.  A  PAP test is done to screen for cervical cancer.  The first PAP test should be done at age 52.  Between ages 59 and 65, PAP tests are repeated every 2 years.  Beginning at age 66, you are advised to have a PAP test every 3 years as long as your past 3 PAP tests have been normal.  Some women have medical problems that increase the chance of getting cervical cancer. Talk to your caregiver about these problems. It is especially important to talk to your caregiver if a new problem develops soon after your last PAP test. In these cases, your caregiver may recommend more frequent screening and Pap tests.  The above recommendations are the same for women who have or have not gotten the vaccine for HPV (Human Papillomavirus).  If you had a hysterectomy for a problem that was not a cancer or a condition that could lead to cancer, then you no longer need Pap tests. However, even if you no longer need a PAP test, a regular exam is a good idea to make sure no other problems are starting.   If you are between ages 73 and 53, and you have had normal Pap tests going back 10 years, you no longer need Pap tests. However, even if you no longer need a PAP test, a regular exam is a good idea to make sure no other problems are starting.  If you have had past treatment for cervical cancer or a condition that could lead to cancer, you need Pap tests and screening for cancer for at least 20 years after your treatment.  If Pap tests have been discontinued, risk factors (such as a new sexual partner)need to be re-assessed to determine if screening should be resumed.  Some women may need screenings more often if they are at high risk for cervical cancer. SEEK MEDICAL CARE IF:   The treated skin becomes red, swollen or painful.  You have an oral temperature above 102 F (38.9 C).  You feel generally ill.  You feel lumps or pimple-like projections in and around your genital area.  You develop bleeding of the  vagina or the treatment area.  You develop painful sexual intercourse. Document Released: 01/15/2004 Document Revised: 01/17/2012 Document Reviewed: 01/30/2014 [facility] Patient Information 2015 [place], [place]. This information is not intended to replace advice given to you by your health care provider. Make sure you discuss any questions you have with your health care provider. Abnormal Pap Test Information During a Pap test, the cells on the surface of your cervix are checked to see if they look normal, abnormal, or if they show signs of having been altered by a certain type of virus called human papillomavirus, or HPV. Cervical cells that have been affected by HPV are called dysplasia. Dysplasia is not cancer, but describes abnormal cells found on the surface of the cervix. Depending on the degree of dysplasia, some of the cells may be considered pre-cancerous and may turn into cancer over time if follow up with a caregiver is delayed.  WHAT DOES AN ABNORMAL PAP  TEST MEAN? Having an abnormal pap test does not mean that you have cancer. However, certain types of abnormal pap tests can be a sign that a person is at a higher risk of developing cancer. Your caregiver will want to do other tests to find out more about the abnormal cells. Your abnormal Pap test results could show:   Small and uncertain changes that should be carefully watched.   Cervical dysplasia that has caused mild changes and can be followed over time.  Cervical dysplasia that is more severe and needs to be followed and treated to ensure the problem goes away.  Cancer.  When severe cervical dysplasia is found and treated early, it rarely will grow into cancer.  WHAT WILL BE DONE ABOUT MY ABNORMAL PAP TEST?  A colposcopy may be needed. This is a procedure where your cervix is examined using light and magnification.  A small tissue sample of your cervix (biopsy) may need to be removed and then examined. This is often  performed if there are areas that appear infected.  A sample of cells from the cervical canal may be removed with either a small brush or scraping instrument (curette). Based on the results of the procedures above, some caregivers may recommend either cryotherapy of the cervix or a surgical LEEP where a portion of the cervix is removed. LEEP is short for "loop electrical excisional procedure." Rarely, a caregiver may recommend a cone biopsy.This is a procedure where a small, cone-shaped sample of your cervix is taken out. The part that is taken out is the area where the abnormal cells are.  WHAT IF I HAVE A DYSPLASIA OR A CANCER? You may be referred to a specialist. Radiation may also be a treatment for more advanced cancer. Having a hysterectomy is the last treatment option for dysplasia, but it is a more common treatment for someone with cancer. All treatment options will be discussed with you by your caregiver. WHAT SHOULD YOU DO AFTER BEING TREATED? If you have had an abnormal pap test, you should continue to have regular pap tests and check-ups as directed by your caregiver. Your cervical problem will be carefully watched so it does not get worse. Also, your caregiver can watch for, and treat, any new problems that may come up. Document Released: 02/09/2011 Document Revised: 02/19/2013 Document Reviewed: 10/21/2011 [facility] Patient Information 2015 [place], [place]. This information is not intended to replace advice given to you by your health care provider. Make sure you discuss any questions you have with your health care provider. Colposcopy Colposcopy is a procedure to examine your cervix and vagina, or the area around the outside of your vagina, for abnormalities or signs of disease. The procedure is done using a lighted microscope called a colposcope. Tissue samples may be collected during the colposcopy if your health care provider finds any unusual cells. A colposcopy may be done if a woman  has:  An abnormal Pap test. A Pap test is a medical test done to evaluate cells that are on the surface of the cervix.  A Pap test result that is suggestive of human papillomavirus (HPV). This virus can cause genital warts and is linked to the development of cervical cancer.  A sore on her cervix and the results of a Pap test were normal.  Genital warts on the cervix or in or around the outside of the vagina.  A mother who took the drug diethylstilbestrol (DES) while pregnant.  Painful intercourse.  Vaginal bleeding, especially after  sexual intercourse. LET [facility] CARE PROVIDER KNOW ABOUT:  Any allergies you have.  All medicines you are taking, including vitamins, herbs, eye drops, creams, and over-the-counter medicines.  Previous problems you or members of your family have had with the use of anesthetics.  Any blood disorders you have.  Previous surgeries you have had.  Medical conditions you have. RISKS AND COMPLICATIONS Generally, a colposcopy is a safe procedure. However, as with any procedure, complications can occur. Possible complications include:  Bleeding.  Infection.  Missed lesions. BEFORE THE PROCEDURE   Tell your health care provider if you have your menstrual period. A colposcopy typically is not done during menstruation.  For 24 hours before the colposcopy, do not:  Douche.  Use tampons.  Use medicines, creams, or suppositories in the vagina.  Have sexual intercourse. PROCEDURE  During the procedure, you will be lying on your back with your feet in foot rests (stirrups). A warm metal or plastic instrument (speculum) will be placed in your vagina to keep it open and to allow the health care provider to see the cervix. The colposcope will be placed outside the vagina. It will be used to magnify and examine the cervix, vagina, and the area around the outside of the vagina. A small amount of liquid solution will be placed on the area that is to be  viewed. This solution will make it easier to see the abnormal cells. Your health care provider will use tools to suck out mucus and cells from the canal of the cervix. Then he or she will record the location of the abnormal areas. If a biopsy is done during the procedure, a medicine will usually be given to numb the area (local anesthetic). You may feel mild pain or cramping while the biopsy is done. After the procedure, tissue samples collected during the biopsy will be sent to a lab for analysis. AFTER THE PROCEDURE  You will be given instructions on when to follow up with your health care provider for your test results. It is important to keep your appointment. Document Released: 01/15/2003 Document Revised: 06/27/2013 Document Reviewed: 05/24/2013 [facility] Patient Information 2015 [place], [place]. This information is not intended to replace advice given to you by your health care provider. Make sure you discuss any questions you have with your health care provider. Colposcopy Colposcopy is a procedure to examine your cervix and vagina, or the area around the outside of your vagina, for abnormalities or signs of disease. The procedure is done using a lighted microscope called a colposcope. Tissue samples may be collected during the colposcopy if your health care provider finds any unusual cells. A colposcopy may be done if a woman has:  An abnormal Pap test. A Pap test is a medical test done to evaluate cells that are on the surface of the cervix.  A Pap test result that is suggestive of human papillomavirus (HPV). This virus can cause genital warts and is linked to the development of cervical cancer.  A sore on her cervix and the results of a Pap test were normal.  Genital warts on the cervix or in or around the outside of the vagina.  A mother who took the drug diethylstilbestrol (DES) while pregnant.  Painful intercourse.  Vaginal bleeding, especially after sexual intercourse. LET [facility] CARE PROVIDER KNOW ABOUT:  Any allergies you have.  All medicines you are taking, including vitamins, herbs, eye drops, creams, and over-the-counter medicines.  Previous problems you or members of your  family have had with the use of anesthetics.  Any blood disorders you have.  Previous surgeries you have had.  Medical conditions you have. RISKS AND COMPLICATIONS Generally, a colposcopy is a safe procedure. However, as with any procedure, complications can occur. Possible complications include:  Bleeding.  Infection.  Missed lesions. BEFORE THE PROCEDURE   Tell your health care provider if you have your menstrual period. A colposcopy typically is not done during menstruation.  For 24 hours before the colposcopy, do not:  Douche.  Use tampons.  Use medicines, creams, or suppositories in the vagina.  Have sexual intercourse. PROCEDURE  During the procedure, you will be lying on your back with your feet in foot rests (stirrups). A warm metal or plastic instrument (speculum) will be placed in your vagina to keep it open and to allow the health care provider to see the cervix. The colposcope will be placed outside the vagina. It will be used to magnify and examine the cervix, vagina, and the area around the outside of the vagina. A small amount of liquid solution will be placed on the area that is to be viewed. This solution will make it easier to see the abnormal cells. Your health care provider will use tools to suck out mucus and cells from the canal of the cervix. Then he or she will record the location of the abnormal areas. If a biopsy is done during the procedure, a medicine will usually be given to numb the area (local anesthetic). You may feel mild pain or cramping while the biopsy is done. After the procedure, tissue samples collected during the biopsy will be sent to a lab for analysis. AFTER THE PROCEDURE  You will be given instructions on when to follow up with  your health care provider for your test results. It is important to keep your appointment. Document Released: 01/15/2003 Document Revised: 06/27/2013 Document Reviewed: 05/24/2013 [facility] Patient Information 2015 [place], [place]. This information is not intended to replace advice given to you by your health care provider. Make sure you discuss any questions you have with your health care provider.

## 2015-06-19 ENCOUNTER — Encounter: Payer: Self-pay | Admitting: Obstetrics

## 2015-06-19 NOTE — Progress Notes (Signed)
Patient ID: Meghan [name], female   DOB: 02/05/1991, 24 y.o.   MRN: [id number]  Chief Complaint  Patient presents with  . Advice Only    discuss abnormal pap    HPI Meghan [name] is a 24 y.o. female.  Presents for consultation for abnormal pap smear.  HPI  Past Medical History  Diagnosis Date  . Anxiety   . Depression   . Headache(784.0)   . Obesity (BMI 30-39.9)     Pt  doesn't  "eat much"  does not eat veggies/fruit  . ADHD (attention deficit hyperactivity disorder)   . Chronic kidney disease     kidney stones  . Asthma   . Post traumatic stress disorder 03/06/2015    Past Surgical History  Procedure Laterality Date  . Wisdom tooth extraction    . Cholecystectomy  2015  . Tonsillectomy  2016    Family History  Problem Relation Age of Onset  . Bipolar disorder Mother   . Anxiety disorder Mother   . Hypertension Mother   . Bipolar disorder Maternal Grandmother   . Hypertension Maternal Grandmother   . Depression Cousin   . Bipolar disorder Other   . Hypertension Maternal Grandfather     Social History Social History  Substance Use Topics  . Smoking status: Never Smoker   . Smokeless tobacco: Never Used  . Alcohol Use: No    Allergies  Allergen Reactions  . Morphine And Related Other (See Comments)    Current Outpatient Prescriptions  Medication Sig Dispense Refill  . albuterol (PROVENTIL HFA;VENTOLIN HFA) 108 (90 BASE) MCG/ACT inhaler Inhale 2 puffs into the lungs.    . busPIRone (BUSPAR) 5 MG tablet Take 1 tablet (5 mg total) by mouth 2 (two) times daily. 60 tablet 1  . Calcium-Magnesium-Vitamin D [phone] MG-MG-UNIT TABS Take by mouth.    . cyclobenzaprine (FLEXERIL) 10 MG tablet Take 10 mg by mouth.    Marland Kitchen FLUoxetine (PROZAC) 20 MG capsule Take 1 capsule (20 mg total) by mouth daily. 30 capsule 1  . hydrOXYzine (ATARAX/VISTARIL) 25 MG tablet Take 1 tablet (25 mg total) by mouth every 8 (eight) hours as needed for anxiety. 30 tablet 1  .  norgestimate-ethinyl estradiol (ORTHO-CYCLEN,SPRINTEC,PREVIFEM) 0.25-35 MG-MCG tablet Take 1 tablet by mouth.    . Prenatal Vit-Fe Fumarate-FA (PRENATAL MULTIVITAMIN) TABS Take 1 tablet by mouth at bedtime.    . topiramate (TOPAMAX) 25 MG tablet Take 125 mg by mouth daily.     . traZODone (DESYREL) 50 MG tablet Take one tablet at bedtime if needed for sleep. 30 tablet 0  . vitamin B-12 (CYANOCOBALAMIN) 100 MCG tablet Take 100 mcg by mouth daily.    . calcium carbonate (TUMS - DOSED IN MG ELEMENTAL CALCIUM) 500 MG chewable tablet Chew 2 tablets by mouth as needed. For heartburn    . fluconazole (DIFLUCAN) 150 MG tablet Take 1 tablet once for yeast infection; may repeat once in 3 days if needed    . ibuprofen (ADVIL,MOTRIN) 600 MG tablet Take 1 tablet (600 mg total) by mouth every 6 (six) hours. (Patient not taking: Reported on 05/07/2015) 30 tablet 1  . mupirocin ointment (BACTROBAN) 2 % Apply to affected area 3 times daily    . naratriptan (AMERGE) 2.5 MG tablet Take one po BID for two days, then take one po daily for 2 days and then take 1/2 po daily for 2 days.     No current facility-administered medications for this visit.    Review  of Systems Review of Systems Constitutional: negative for fatigue and weight loss Respiratory: negative for cough and wheezing Cardiovascular: negative for chest pain, fatigue and palpitations Gastrointestinal: negative for abdominal pain and change in bowel habits Genitourinary:negative Integument/breast: negative for nipple discharge Musculoskeletal:negative for myalgias Neurological: negative for gait problems and tremors Behavioral/Psych: negative for abusive relationship, depression Endocrine: negative for temperature intolerance     Blood pressure 109/80, pulse 91, temperature 98.4 F (36.9 C), weight 206 lb (93.441 kg), last menstrual period 05/09/2015, unknown if currently breastfeeding.  Physical Exam Physical Exam: Deferred  100% of 10 min  visit spent on counseling and coordination of care.   Data Reviewed Pap smear  Assessment     ASCUS with positive High Risk DNA     Plan    Colposcopy scheduled Educational material dispensed   No orders of the defined types were placed in this encounter.   Meds ordered this encounter  Medications  . vitamin B-12 (CYANOCOBALAMIN) 100 MCG tablet    Sig: Take 100 mcg by mouth daily.

## 2015-06-27 ENCOUNTER — Ambulatory Visit (HOSPITAL_COMMUNITY): Payer: Federal, State, Local not specified - Other | Admitting: Psychiatry

## 2015-06-27 ENCOUNTER — Telehealth: Payer: Self-pay | Admitting: *Deleted

## 2015-06-27 NOTE — Telephone Encounter (Signed)
Patient in office recently for abnormal pap smear. Per guidelines patient to follow up in one year with repeat pap.  Attempted to contact the patient and left message for patient to call the office.

## 2015-07-11 ENCOUNTER — Encounter (HOSPITAL_COMMUNITY): Payer: Self-pay | Admitting: Psychiatry

## 2015-07-11 ENCOUNTER — Ambulatory Visit (INDEPENDENT_AMBULATORY_CARE_PROVIDER_SITE_OTHER): Payer: Federal, State, Local not specified - Other | Admitting: Psychiatry

## 2015-07-11 DIAGNOSIS — F411 Generalized anxiety disorder: Secondary | ICD-10-CM | POA: Diagnosis not present

## 2015-07-11 DIAGNOSIS — F329 Major depressive disorder, single episode, unspecified: Secondary | ICD-10-CM | POA: Diagnosis not present

## 2015-07-11 DIAGNOSIS — F4310 Post-traumatic stress disorder, unspecified: Secondary | ICD-10-CM

## 2015-07-11 DIAGNOSIS — F331 Major depressive disorder, recurrent, moderate: Secondary | ICD-10-CM

## 2015-07-11 DIAGNOSIS — F5105 Insomnia due to other mental disorder: Secondary | ICD-10-CM

## 2015-07-11 MED ORDER — BUSPIRONE HCL 7.5 MG PO TABS: 7.5000 mg | ORAL_TABLET | Freq: Two times a day (BID) | ORAL | Status: DC

## 2015-07-11 MED ORDER — FLUOXETINE HCL 40 MG PO CAPS: 40.0000 mg | ORAL_CAPSULE | Freq: Every day | ORAL | Status: DC

## 2015-07-11 MED ORDER — HYDROXYZINE HCL 25 MG PO TABS: 25.0000 mg | ORAL_TABLET | Freq: Every evening | ORAL | Status: DC | PRN
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
## 2015-07-25 ENCOUNTER — Ambulatory Visit (HOSPITAL_COMMUNITY): Payer: Federal, State, Local not specified - Other | Admitting: Psychiatry

## 2015-09-22 ENCOUNTER — Encounter (HOSPITAL_COMMUNITY): Payer: Self-pay | Admitting: *Deleted

## 2015-09-22 ENCOUNTER — Inpatient Hospital Stay (HOSPITAL_COMMUNITY)
Admission: AD | Admit: 2015-09-22 | Discharge: 2015-09-22 | Disposition: A | Discharge status: Home / Self Care | Payer: Medicaid Other | Source: Ambulatory Visit | Attending: Family Medicine | Admitting: Family Medicine

## 2015-09-22 ENCOUNTER — Inpatient Hospital Stay (HOSPITAL_COMMUNITY): Payer: Medicaid Other

## 2015-09-22 DIAGNOSIS — Z3A01 Less than 8 weeks gestation of pregnancy: Secondary | ICD-10-CM | POA: Diagnosis not present

## 2015-09-22 DIAGNOSIS — O209 Hemorrhage in early pregnancy, unspecified: Secondary | ICD-10-CM | POA: Insufficient documentation

## 2015-09-22 DIAGNOSIS — R102 Pelvic and perineal pain: Secondary | ICD-10-CM | POA: Insufficient documentation

## 2015-09-22 DIAGNOSIS — O200 Threatened abortion: Secondary | ICD-10-CM

## 2015-09-22 DIAGNOSIS — O4691 Antepartum hemorrhage, unspecified, first trimester: Secondary | ICD-10-CM

## 2015-09-22 HISTORY — DX: Post-traumatic stress disorder, unspecified: F43.10

## 2015-09-22 LAB — WET PREP, GENITAL
Trich, Wet Prep: NONE SEEN
Yeast Wet Prep HPF POC: NONE SEEN

## 2015-09-22 LAB — URINALYSIS, ROUTINE W REFLEX MICROSCOPIC
BILIRUBIN URINE: NEGATIVE
GLUCOSE, UA: NEGATIVE mg/dL
Hgb urine dipstick: NEGATIVE
Ketones, ur: NEGATIVE mg/dL
Leukocytes, UA: NEGATIVE
NITRITE: NEGATIVE
PH: 7 (ref 5.0–8.0)
Protein, ur: NEGATIVE mg/dL
SPECIFIC GRAVITY, URINE: 1.025 (ref 1.005–1.030)
Urobilinogen, UA: 0.2 mg/dL (ref 0.0–1.0)

## 2015-09-22 LAB — HCG, QUANTITATIVE, PREGNANCY: HCG, BETA CHAIN, QUANT, S: 91864 m[IU]/mL — AB (ref <5)

## 2015-09-22 LAB — POCT PREGNANCY, URINE: Preg Test, Ur: POSITIVE — AB

## 2015-09-22 LAB — OB RESULTS CONSOLE GC/CHLAMYDIA: GC PROBE AMP, GENITAL: NEGATIVE

## 2015-09-22 MED ORDER — PROMETHAZINE HCL 25 MG PO TABS: 12.5000 mg | ORAL_TABLET | Freq: Four times a day (QID) | ORAL | Status: DC | PRN

## 2015-09-22 NOTE — MAU Provider Note (Signed)
History     CSN: [id number]  Arrival date and time: 09/22/15 [id number]   First Provider Initiated Contact with Patient 09/22/15 2110      No chief complaint on file.  HPI Comments: LMP 09/08/15   Pelvic Pain The patient's primary symptoms include pelvic pain and vaginal bleeding. This is a new problem. The current episode started yesterday. The problem occurs constantly. The problem has been unchanged. The pain is moderate (8/10). The problem affects both sides. She is pregnant. Associated symptoms include abdominal pain, nausea and vomiting (x4-5/day ). Pertinent negatives include no chills, constipation, diarrhea, dysuria, fever, frequency or urgency. The vaginal discharge was bloody. The vaginal bleeding is spotting. She has not been passing clots. She has not been passing tissue. Nothing aggravates the symptoms. She has tried acetaminophen for the symptoms. The treatment provided no relief. She is sexually active. It is unknown whether or not her partner has an STD. She uses nothing for contraception.     Past Medical History  Diagnosis Date  . Anxiety   . Depression   . Headache(784.0)   . Obesity (BMI 30-39.9)     Pt  doesn't  "eat much"  does not eat veggies/fruit  . ADHD (attention deficit hyperactivity disorder)   . Chronic kidney disease     kidney stones  . Asthma   . Post traumatic stress disorder 03/06/2015  . PTSD (post-traumatic stress disorder)     Past Surgical History  Procedure Laterality Date  . Wisdom tooth extraction    . Cholecystectomy  2015  . Tonsillectomy  2016    Family History  Problem Relation Age of Onset  . Bipolar disorder Mother   . Anxiety disorder Mother   . Hypertension Mother   . Bipolar disorder Maternal Grandmother   . Hypertension Maternal Grandmother   . Depression Cousin   . Bipolar disorder Other   . Hypertension Maternal Grandfather     Social History  Substance Use Topics  . Smoking status: Never Smoker   . Smokeless  tobacco: Never Used  . Alcohol Use: No    Allergies:  Allergies  Allergen Reactions  . Morphine And Related Other (See Comments)    Prescriptions prior to admission  Medication Sig Dispense Refill Last Dose  . albuterol (PROVENTIL HFA;VENTOLIN HFA) 108 (90 BASE) MCG/ACT inhaler Inhale 2 puffs into the lungs.   Taking  . busPIRone (BUSPAR) 7.5 MG tablet Take 1 tablet (7.5 mg total) by mouth 2 (two) times daily. 60 tablet 0   . calcium carbonate (TUMS - DOSED IN MG ELEMENTAL CALCIUM) 500 MG chewable tablet Chew 2 tablets by mouth as needed. For heartburn   Taking  . Calcium-Magnesium-Vitamin D [id number] MG-MG-UNIT TABS Take by mouth.   Taking  . cyclobenzaprine (FLEXERIL) 10 MG tablet Take 10 mg by mouth.   Taking  . fluconazole (DIFLUCAN) 150 MG tablet Take 1 tablet once for yeast infection; may repeat once in 3 days if needed   Not Taking  . FLUoxetine (PROZAC) 40 MG capsule Take 1 capsule (40 mg total) by mouth daily. 30 capsule 0   . hydrOXYzine (ATARAX/VISTARIL) 25 MG tablet Take 1 tablet (25 mg total) by mouth at bedtime as needed for anxiety. 30 tablet 1   . ibuprofen (ADVIL,MOTRIN) 600 MG tablet Take 1 tablet (600 mg total) by mouth every 6 (six) hours. (Patient not taking: Reported on 05/07/2015) 30 tablet 1 Not Taking  . naratriptan (AMERGE) 2.5 MG tablet Take one po BID for two  days, then take one po daily for 2 days and then take 1/2 po daily for 2 days.   Not Taking  . norgestimate-ethinyl estradiol (ORTHO-CYCLEN,SPRINTEC,PREVIFEM) 0.25-35 MG-MCG tablet Take 1 tablet by mouth.   Taking  . Prenatal Vit-Fe Fumarate-FA (PRENATAL MULTIVITAMIN) TABS Take 1 tablet by mouth at bedtime.   Taking  . topiramate (TOPAMAX) 25 MG tablet Take 125 mg by mouth daily.    Taking  . traZODone (DESYREL) 50 MG tablet Take one tablet at bedtime if needed for sleep. 30 tablet 0 Taking  . vitamin B-12 (CYANOCOBALAMIN) 100 MCG tablet Take 100 mcg by mouth daily.   Taking    Review of Systems   Constitutional: Negative for fever and chills.  Gastrointestinal: Positive for nausea, vomiting (x4-5/day ) and abdominal pain. Negative for diarrhea and constipation.  Genitourinary: Positive for pelvic pain. Negative for dysuria, urgency and frequency.   Physical Exam   Blood pressure 128/76, pulse 101, temperature 98.3 F (36.8 C), resp. rate 18, last menstrual period 09/14/2015, unknown if currently breastfeeding.  Physical Exam  Nursing note and vitals reviewed. Constitutional: She appears well-developed and well-nourished. No distress.  HENT:  Head: Normocephalic.  Cardiovascular: Normal rate.   Respiratory: Effort normal.  GI: Soft. There is no tenderness. There is no rebound.  Neurological: She is alert.  Skin: Skin is warm and dry.  Psychiatric: She has a normal mood and affect.   Results for orders placed or performed during the hospital encounter of 09/22/15 (from the past 24 hour(s))  Urinalysis, Routine w reflex microscopic (not at [facility])     Status: None   Collection Time: 09/22/15  6:10 PM  Result Value Ref Range   Color, Urine YELLOW YELLOW   APPearance CLEAR CLEAR   Specific Gravity, Urine 1.025 1.005 - 1.030   pH 7.0 5.0 - 8.0   Glucose, UA NEGATIVE NEGATIVE mg/dL   Hgb urine dipstick NEGATIVE NEGATIVE   Bilirubin Urine NEGATIVE NEGATIVE   Ketones, ur NEGATIVE NEGATIVE mg/dL   Protein, ur NEGATIVE NEGATIVE mg/dL   Urobilinogen, UA 0.2 0.0 - 1.0 mg/dL   Nitrite NEGATIVE NEGATIVE   Leukocytes, UA NEGATIVE NEGATIVE  Pregnancy, urine POC     Status: Abnormal   Collection Time: 09/22/15  6:32 PM  Result Value Ref Range   Preg Test, Ur POSITIVE (A) NEGATIVE  Wet prep, genital     Status: Abnormal   Collection Time: 09/22/15  9:05 PM  Result Value Ref Range   Yeast Wet Prep HPF POC NONE SEEN NONE SEEN   Trich, Wet Prep NONE SEEN NONE SEEN   Clue Cells Wet Prep HPF POC FEW (A) NONE SEEN   WBC, Wet Prep HPF POC MODERATE (A) NONE SEEN   US Ob Comp Less 14  Wks  09/22/2015  CLINICAL DATA:  Acute onset of vaginal bleeding.  Initial encounter. EXAM: OBSTETRIC <14 WK [place] AND TRANSVAGINAL OB US TECHNIQUE: Both transabdominal and transvaginal ultrasound examinations were performed for complete evaluation of the gestation as well as the maternal uterus, adnexal regions, and pelvic cul-de-sac. Transvaginal technique was performed to assess early pregnancy. COMPARISON:  Pelvic ultrasound performed 12/04/2012 FINDINGS: Intrauterine gestational sac: Visualized/normal in shape. Yolk sac:  Yes Embryo:  Yes Cardiac Activity: Yes Heart Rate: 153  bpm CRL:  1.3 cm   7 w   4 d                  [place] EDC: 05/06/2016 Maternal uterus/adnexae: A small amount of subchorionic  hemorrhage is noted, measuring 2.0 x 1.8 x 1.0 cm. The uterus is otherwise unremarkable in appearance. The ovaries are within normal limits. The right ovary measures 4.1 x 2.7 x 2.5 cm, while the left ovary measures 3.4 x 1.8 x 2.1 cm. No suspicious adnexal masses are seen; there is no evidence for ovarian torsion. No free fluid is seen within the pelvic cul-de-sac. IMPRESSION: 1. Single live intrauterine pregnancy noted, with a crown-rump length of 1.3 cm, corresponding to a gestational age of [redacted] weeks 4 days. This does not match the gestational age by LMP, and reflects a new estimated date of delivery of May 06, 2016. 2. Small amount of subchorionic hemorrhage noted. Electronically Signed   By: Roanna Raider M.D.   On: 09/22/2015 21:49   US Ob Transvaginal  09/22/2015  CLINICAL DATA:  Acute onset of vaginal bleeding.  Initial encounter. EXAM: OBSTETRIC <14 WK [place] AND TRANSVAGINAL OB US TECHNIQUE: Both transabdominal and transvaginal ultrasound examinations were performed for complete evaluation of the gestation as well as the maternal uterus, adnexal regions, and pelvic cul-de-sac. Transvaginal technique was performed to assess early pregnancy. COMPARISON:  Pelvic ultrasound performed 12/04/2012 FINDINGS:  Intrauterine gestational sac: Visualized/normal in shape. Yolk sac:  Yes Embryo:  Yes Cardiac Activity: Yes Heart Rate: 153  bpm CRL:  1.3 cm   7 w   4 d                  [place] EDC: 05/06/2016 Maternal uterus/adnexae: A small amount of subchorionic hemorrhage is noted, measuring 2.0 x 1.8 x 1.0 cm. The uterus is otherwise unremarkable in appearance. The ovaries are within normal limits. The right ovary measures 4.1 x 2.7 x 2.5 cm, while the left ovary measures 3.4 x 1.8 x 2.1 cm. No suspicious adnexal masses are seen; there is no evidence for ovarian torsion. No free fluid is seen within the pelvic cul-de-sac. IMPRESSION: 1. Single live intrauterine pregnancy noted, with a crown-rump length of 1.3 cm, corresponding to a gestational age of [redacted] weeks 4 days. This does not match the gestational age by LMP, and reflects a new estimated date of delivery of May 06, 2016. 2. Small amount of subchorionic hemorrhage noted. Electronically Signed   By: Roanna Raider M.D.   On: 09/22/2015 21:49    MAU Course  Procedures  MDM   Assessment and Plan   1. [redacted] weeks gestation of pregnancy   2. Vaginal bleeding in pregnancy, first trimester   3. Bloody show and cramping in early pregnancy    DC home Comfort measures reviewed  1st Trimester precautions  Bleeding precautions Ectopic precautions RX: phenergan #30  Return to MAU as needed Start [facility] as soon as possible  Follow-up Information    Schedule an appointment as soon as possible for a visit with [facility].   Contact information:   [address] [place] [address] [phone]         Tawnya Crook 09/22/2015, 9:12 PM

## 2015-09-22 NOTE — Discharge Instructions (Signed)
Prenatal Care Providers °[facility]    [facility] ° Phone- [phone]     Phone: [phone] °         °[facility]                      [facility] ° @[place]     Phone: [phone] ° Phone: [phone] °        [facility] °[facility]     Phone: [phone] ° Phone: [phone]   °        [facility] °[facility] [place]                hone: [phone] ° Phone: [phone] °        [facility] °Dr. Bernard Marshall      Phone: [phone] ° Phone: [phone] °        [facility] °[facility]                Phone: [phone] ° Women’s Health  ° Phone:[phone]    [facility] ([place]) °         Phone: [phone] °[facility] °  Phone: [phone] °Safe Medications in Pregnancy  ° °Acne: °Benzoyl Peroxide °Salicylic Acid ° °Backache/Headache: °Tylenol: 2 regular strength every 4 hours OR °             2 Extra strength every 6 hours ° °Colds/Coughs/Allergies: °Benadryl (alcohol free) 25 mg every 6 hours as needed °Breath right strips °Claritin °Cepacol throat lozenges °Chloraseptic throat spray °Cold-Eeze- up to three times per day °Cough drops, alcohol free °Flonase (by prescription only) °Guaifenesin °Mucinex °Robitussin DM (plain only, alcohol free) °Saline nasal spray/drops °Sudafed (pseudoephedrine) & Actifed ** use only after [redacted] weeks gestation and if you do not have high blood pressure °Tylenol °Vicks Vaporub °Zinc lozenges °Zyrtec  ° °Constipation: °Colace °Ducolax suppositories °Fleet enema °Glycerin suppositories °Metamucil °Milk of magnesia °Miralax °Senokot °Smooth move tea ° °Diarrhea: °Kaopectate °Imodium A-D ° °*NO pepto Bismol ° °Hemorrhoids: °Anusol °Anusol HC °Preparation H °Tucks ° °Indigestion: °Tums °Maalox °Mylanta °Zantac  °Pepcid ° °Insomnia: °Benadryl (alcohol free) 25mg every 6 hours as needed °Tylenol  PM °Unisom, no Gelcaps ° °Leg Cramps: °Tums °MagGel ° °Nausea/Vomiting:  °Bonine °Dramamine °Emetrol °Ginger extract °Sea bands °Meclizine  °Nausea medication to take during pregnancy:  °Unisom (doxylamine succinate 25 mg tablets) Take one tablet daily at bedtime. If symptoms are not adequately controlled, the dose can be increased to a maximum recommended dose of two tablets daily (1/2 tablet in the morning, 1/2 tablet mid-afternoon and one at bedtime). °Vitamin B6 100mg tablets. Take one tablet twice a day (up to 200 mg per day). ° °Skin Rashes: °Aveeno products °Benadryl cream or 25mg every 6 hours as needed °Calamine Lotion °1% cortisone cream ° °Yeast infection: °Gyne-lotrimin 7 °Monistat 7 ° ° °**If taking multiple medications, please check labels to avoid duplicating the same active ingredients °**take medication as directed on the label °** Do not exceed 4000 mg of tylenol in 24 hours °**Do not take medications that contain aspirin or ibuprofen ° ° ° ° °

## 2015-09-22 NOTE — MAU Note (Signed)
Pt presents to MAU with complaints of vaginal bleeding with lower abdominal and back pain.

## 2015-09-23 ENCOUNTER — Other Ambulatory Visit: Payer: Medicaid Other

## 2015-09-23 LAB — GC/CHLAMYDIA PROBE AMP (~~LOC~~) NOT AT ARMC
CHLAMYDIA, DNA PROBE: NEGATIVE
Neisseria Gonorrhea: NEGATIVE

## 2015-09-26 NOTE — Telephone Encounter (Signed)
Patient has been seen in the office since this phone call note.

## 2015-10-13 ENCOUNTER — Other Ambulatory Visit: Payer: Self-pay | Admitting: Certified Nurse Midwife

## 2015-10-16 ENCOUNTER — Encounter: Payer: Self-pay | Admitting: Obstetrics

## 2015-10-16 ENCOUNTER — Ambulatory Visit (INDEPENDENT_AMBULATORY_CARE_PROVIDER_SITE_OTHER): Payer: Medicaid Other | Admitting: Obstetrics

## 2015-10-16 VITALS — BP 110/75 | HR 93 | Temp 98.5°F | Wt 218.0 lb

## 2015-10-16 DIAGNOSIS — Z3481 Encounter for supervision of other normal pregnancy, first trimester: Secondary | ICD-10-CM | POA: Diagnosis not present

## 2015-10-16 LAB — POCT URINALYSIS DIPSTICK
BILIRUBIN UA: NEGATIVE
GLUCOSE UA: NEGATIVE
Leukocytes, UA: NEGATIVE
Nitrite, UA: NEGATIVE
Protein, UA: NEGATIVE
RBC UA: NEGATIVE
SPEC GRAV UA: 1.02
Urobilinogen, UA: NEGATIVE
pH, UA: 5

## 2015-10-16 LAB — TSH: TSH: 0.381 u[IU]/mL (ref 0.350–4.500)

## 2015-10-16 NOTE — Progress Notes (Signed)
Subjective:    Meghan [name] is being seen today for her first obstetrical visit.  This is not a planned pregnancy. She is at 5718w0d gestation. Her obstetrical history is significant for none. Relationship with FOB: significant other, not living together. Patient does intend to breast feed. Pregnancy history fully reviewed.  The information documented in the HPI was reviewed and verified.  Menstrual History: OB History    Gravida Para Term Preterm AB TAB SAB Ectopic Multiple Living   2 1 1  0 0 0 0 0 0 1     Patient's last menstrual period was 09/14/2015.    Past Medical History  Diagnosis Date  . Anxiety   . Depression   . Headache(784.0)   . Obesity (BMI 30-39.9)     Pt  doesn't  "eat much"  does not eat veggies/fruit  . ADHD (attention deficit hyperactivity disorder)   . Chronic kidney disease     kidney stones  . Asthma   . Post traumatic stress disorder 03/06/2015  . PTSD (post-traumatic stress disorder)     Past Surgical History  Procedure Laterality Date  . Wisdom tooth extraction    . Cholecystectomy  2015  . Tonsillectomy  2016     (Not in a hospital admission) Allergies  Allergen Reactions  . Morphine And Related Other (See Comments)    Social History  Substance Use Topics  . Smoking status: Never Smoker   . Smokeless tobacco: Never Used  . Alcohol Use: No    Family History  Problem Relation Age of Onset  . Bipolar disorder Mother   . Anxiety disorder Mother   . Hypertension Mother   . Bipolar disorder Maternal Grandmother   . Hypertension Maternal Grandmother   . Depression Cousin   . Bipolar disorder Other   . Hypertension Maternal Grandfather      Review of Systems Constitutional: negative for weight loss Gastrointestinal: negative for vomiting Genitourinary:negative for genital lesions and vaginal discharge and dysuria Musculoskeletal:negative for back pain Behavioral/Psych: negative for abusive relationship, depression, illegal drug usage  and tobacco use    Objective:    LMP 09/14/2015 General Appearance:    Alert, cooperative, no distress, appears stated age  Head:    Normocephalic, without obvious abnormality, atraumatic  Eyes:    PERRL, conjunctiva/corneas clear, EOM's intact, fundi    benign, both eyes  Ears:    Normal TM's and external ear canals, both ears  Nose:   Nares normal, septum midline, mucosa normal, no drainage    or sinus tenderness  Throat:   Lips, mucosa, and tongue normal; teeth and gums normal  Neck:   Supple, symmetrical, trachea midline, no adenopathy;    thyroid:  no enlargement/tenderness/nodules; no carotid   bruit or JVD  Back:     Symmetric, no curvature, ROM normal, no CVA tenderness  Lungs:     Clear to auscultation bilaterally, respirations unlabored  Chest Wall:    No tenderness or deformity   Heart:    Regular rate and rhythm, S1 and S2 normal, no murmur, rub   or gallop  Breast Exam:    No tenderness, masses, or nipple abnormality  Abdomen:     Soft, non-tender, bowel sounds active all four quadrants,    no masses, no organomegaly  Genitalia:    Normal female without lesion, discharge or tenderness  Extremities:   Extremities normal, atraumatic, no cyanosis or edema  Pulses:   2+ and symmetric all extremities  Skin:   Skin  color, texture, turgor normal, no rashes or lesions  Lymph nodes:   Cervical, supraclavicular, and axillary nodes normal  Neurologic:   CNII-XII intact, normal strength, sensation and reflexes    throughout      Lab Review Urine pregnancy test Labs reviewed yes Radiologic studies reviewed yes Assessment:    Pregnancy at [redacted]w[redacted]d weeks    Plan:      Prenatal vitamins.  Counseling provided regarding continued use of seat belts, cessation of alcohol consumption, smoking or use of illicit drugs; infection precautions i.e., influenza/TDAP immunizations, toxoplasmosis,CMV, parvovirus, listeria and varicella; workplace safety, exercise during pregnancy; routine  dental care, safe medications, sexual activity, hot tubs, saunas, pools, travel, caffeine use, fish and methlymercury, potential toxins, hair treatments, varicose veins Weight gain recommendations per IOM guidelines reviewed: underweight/BMI< 18.5--> gain 28 - 40 lbs; normal weight/BMI 18.5 - 24.9--> gain 25 - 35 lbs; overweight/BMI 25 - 29.9--> gain 15 - 25 lbs; obese/BMI >30->gain  11 - 20 lbs Problem list reviewed and updated. FIRST/CF mutation testing/NIPT/QUAD SCREEN/fragile X/Ashkenazi Jewish population testing/Spinal muscular atrophy discussed: requested. Role of ultrasound in pregnancy discussed; fetal survey: requested. Amniocentesis discussed: not indicated. VBAC calculator score: VBAC consent form provided No orders of the defined types were placed in this encounter.   No orders of the defined types were placed in this encounter.    Follow up in 4 weeks.

## 2015-10-17 LAB — OBSTETRIC PANEL
ANTIBODY SCREEN: NEGATIVE
BASOS PCT: 0 % (ref 0–1)
Basophils Absolute: 0 10*3/uL (ref 0.0–0.1)
EOS PCT: 1 % (ref 0–5)
Eosinophils Absolute: 0.1 10*3/uL (ref 0.0–0.7)
HEMATOCRIT: 37.6 % (ref 36.0–46.0)
HEP B S AG: NEGATIVE
Hemoglobin: 12.9 g/dL (ref 12.0–15.0)
Lymphocytes Relative: 33 % (ref 12–46)
Lymphs Abs: 2.4 10*3/uL (ref 0.7–4.0)
MCH: 28.9 pg (ref 26.0–34.0)
MCHC: 34.3 g/dL (ref 30.0–36.0)
MCV: 84.1 fL (ref 78.0–100.0)
MONO ABS: 0.5 10*3/uL (ref 0.1–1.0)
MONOS PCT: 7 % (ref 3–12)
MPV: 9.3 fL (ref 8.6–12.4)
NEUTROS ABS: 4.2 10*3/uL (ref 1.7–7.7)
Neutrophils Relative %: 59 % (ref 43–77)
PLATELETS: 203 10*3/uL (ref 150–400)
RBC: 4.47 MIL/uL (ref 3.87–5.11)
RDW: 13.5 % (ref 11.5–15.5)
RH TYPE: POSITIVE
Rubella: 1.22 Index — ABNORMAL HIGH (ref <0.90)
WBC: 7.2 10*3/uL (ref 4.0–10.5)

## 2015-10-17 LAB — VARICELLA ZOSTER ANTIBODY, IGG: VARICELLA IGG: 820.1 {index} — AB (ref <135.00)

## 2015-10-17 LAB — HIV ANTIBODY (ROUTINE TESTING W REFLEX): HIV 1&2 Ab, 4th Generation: NONREACTIVE

## 2015-10-17 LAB — VITAMIN D 25 HYDROXY (VIT D DEFICIENCY, FRACTURES): VIT D 25 HYDROXY: 23 ng/mL — AB (ref 30–100)

## 2015-10-18 LAB — CULTURE, OB URINE

## 2015-11-06 ENCOUNTER — Inpatient Hospital Stay (HOSPITAL_COMMUNITY)
Admission: AD | Admit: 2015-11-06 | Discharge: 2015-11-06 | Disposition: A | Discharge status: Home / Self Care | Payer: Medicaid Other | Source: Ambulatory Visit | Attending: Obstetrics | Admitting: Obstetrics

## 2015-11-06 ENCOUNTER — Encounter (HOSPITAL_COMMUNITY): Payer: Self-pay | Admitting: *Deleted

## 2015-11-06 DIAGNOSIS — Z3A14 14 weeks gestation of pregnancy: Secondary | ICD-10-CM | POA: Insufficient documentation

## 2015-11-06 DIAGNOSIS — N189 Chronic kidney disease, unspecified: Secondary | ICD-10-CM | POA: Insufficient documentation

## 2015-11-06 DIAGNOSIS — R509 Fever, unspecified: Secondary | ICD-10-CM | POA: Insufficient documentation

## 2015-11-06 DIAGNOSIS — F329 Major depressive disorder, single episode, unspecified: Secondary | ICD-10-CM | POA: Insufficient documentation

## 2015-11-06 DIAGNOSIS — O219 Vomiting of pregnancy, unspecified: Secondary | ICD-10-CM | POA: Diagnosis not present

## 2015-11-06 DIAGNOSIS — J45909 Unspecified asthma, uncomplicated: Secondary | ICD-10-CM | POA: Diagnosis not present

## 2015-11-06 DIAGNOSIS — F4310 Post-traumatic stress disorder, unspecified: Secondary | ICD-10-CM | POA: Diagnosis not present

## 2015-11-06 DIAGNOSIS — F419 Anxiety disorder, unspecified: Secondary | ICD-10-CM | POA: Diagnosis not present

## 2015-11-06 DIAGNOSIS — K529 Noninfective gastroenteritis and colitis, unspecified: Secondary | ICD-10-CM | POA: Diagnosis not present

## 2015-11-06 DIAGNOSIS — E669 Obesity, unspecified: Secondary | ICD-10-CM | POA: Diagnosis not present

## 2015-11-06 DIAGNOSIS — F909 Attention-deficit hyperactivity disorder, unspecified type: Secondary | ICD-10-CM | POA: Insufficient documentation

## 2015-11-06 DIAGNOSIS — R197 Diarrhea, unspecified: Secondary | ICD-10-CM | POA: Insufficient documentation

## 2015-11-06 LAB — URINE MICROSCOPIC-ADD ON: RBC / HPF: NONE SEEN RBC/hpf (ref 0–5)

## 2015-11-06 LAB — URINALYSIS, ROUTINE W REFLEX MICROSCOPIC
BILIRUBIN URINE: NEGATIVE
HGB URINE DIPSTICK: NEGATIVE
KETONES UR: NEGATIVE mg/dL
Leukocytes, UA: NEGATIVE
Nitrite: NEGATIVE
PH: 6 (ref 5.0–8.0)
Protein, ur: NEGATIVE mg/dL

## 2015-11-06 MED ORDER — THIAMINE HCL 100 MG/ML IJ SOLN
Freq: Once | INTRAVENOUS | Status: AC
  Administered 2015-11-06: 22:00:00 via INTRAVENOUS
  Filled 2015-11-06: qty 1000

## 2015-11-06 MED ORDER — PROMETHAZINE HCL 25 MG/ML IJ SOLN
25.0000 mg | INTRAVENOUS | Status: DC
  Administered 2015-11-06: 25 mg via INTRAVENOUS
  Filled 2015-11-06: qty 1

## 2015-11-06 MED ORDER — ACETAMINOPHEN 500 MG PO TABS
1000.0000 mg | ORAL_TABLET | Freq: Once | ORAL | Status: AC
  Administered 2015-11-06: 1000 mg via ORAL
  Filled 2015-11-06: qty 2

## 2015-11-06 MED ORDER — PROMETHAZINE HCL 25 MG PO TABS: 12.5000 mg | ORAL_TABLET | Freq: Four times a day (QID) | ORAL | Status: DC | PRN

## 2015-11-06 NOTE — MAU Note (Signed)
PT  SAYS    SHE   STARTED  VOMITING    YESTERDAY    AT 4  PM     .  HAS  HAD  MORNING   SICKNESS      WITH  PREG  -     USED  LAST  PHENERGAN    THIS   MORN  AT 0200.       CALLED  OFFICE  TODAY  AND  THEY  TOLD  HER    HER  TO CALL 911.        HAD  WATERY     DIARRHEA-   STARTED  YESTERDAY  -   .     HAD  FEVER  OF  100.7-  AT  0200.-       TOOK    2 XS TYLENOL    .   LAST  SEEN IN OFFICE  ON 12-6.

## 2015-11-06 NOTE — Discharge Instructions (Signed)

## 2015-11-06 NOTE — MAU Provider Note (Signed)
History     CSN: [id number]  Arrival date and time: 11/06/15 1901   None     No chief complaint on file.  HPI Ms Rampy is a 24yo G2P1001 @ 14.0wks by first tri scan who presents for eval of N/V that began yesterday and has kept her from keeping anything down; also began w/ diarrhea yesterday. Reports T last PM of 100.7 but none since. No antipyretics today. Reports H/A.  Has been having intermittent vag bldg due to [facility] which is being followed by Dr Clearance Coots. No leaking or abnl vag d/c. No dysuria.  OB History    Gravida Para Term Preterm AB TAB SAB Ectopic Multiple Living   0 0 0 0 0 0 1      Past Medical History  Diagnosis Date  . Anxiety   . Depression   . Headache(784.0)   . Obesity (BMI 30-39.9)     Pt  doesn't  "eat much"  does not eat veggies/fruit  . ADHD (attention deficit hyperactivity disorder)   . Chronic kidney disease     kidney stones  . Asthma   . Post traumatic stress disorder 03/06/2015  . PTSD (post-traumatic stress disorder)     Past Surgical History  Procedure Laterality Date  . Wisdom tooth extraction    . Cholecystectomy  2015  . Tonsillectomy  2016    Family History  Problem Relation Age of Onset  . Bipolar disorder Mother   . Anxiety disorder Mother   . Hypertension Mother   . Bipolar disorder Maternal Grandmother   . Hypertension Maternal Grandmother   . Depression Cousin   . Bipolar disorder Other   . Hypertension Maternal Grandfather     Social History  Substance Use Topics  . Smoking status: Never Smoker   . Smokeless tobacco: Never Used  . Alcohol Use: No    Allergies: No Known Allergies  Prescriptions prior to admission  Medication Sig Dispense Refill Last Dose  . busPIRone (BUSPAR) 7.5 MG tablet Take 1 tablet (7.5 mg total) by mouth 2 (two) times daily. 60 tablet 0 11/05/2015 at Unknown time  . FLUoxetine (PROZAC) 40 MG capsule Take 1 capsule (40 mg total) by mouth daily. 30 capsule 0 11/05/2015 at Unknown time  .  hydrOXYzine (ATARAX/VISTARIL) 25 MG tablet Take 1 tablet (25 mg total) by mouth at bedtime as needed for anxiety. 30 tablet 1 Past Week at Unknown time  . albuterol (PROVENTIL HFA;VENTOLIN HFA) 108 (90 BASE) MCG/ACT inhaler Inhale 2 puffs into the lungs.   Taking  . calcium carbonate (TUMS - DOSED IN MG ELEMENTAL CALCIUM) 500 MG chewable tablet Chew 2 tablets by mouth as needed. For heartburn   Taking  . Prenatal Vit-Fe Fumarate-FA (PRENATAL MULTIVITAMIN) TABS Take 1 tablet by mouth at bedtime.   Taking  . promethazine (PHENERGAN) 25 MG tablet Take 0.5-1 tablets (12.5-25 mg total) by mouth every 6 (six) hours as needed. 30 tablet 0 Taking    ROS Physical Exam   Blood pressure 104/68, pulse 100, temperature 98.5 F (36.9 C), temperature source Oral, resp. rate 18, height 5' (1.524 m), weight 96.276 kg (212 lb 4 oz), last menstrual period 09/14/2015, unknown if currently breastfeeding.  Physical Exam  Constitutional: She is oriented to person, place, and time. She appears well-developed.  Appears fatigued; speaking quietly w/ eyes closed  HENT:  Head: Normocephalic.  Neck: Normal range of motion.  Cardiovascular: Normal rate.   Respiratory: Effort normal.  GI:  Gravid,  soft, FHR dopplered 163bpm  Musculoskeletal: Normal range of motion.  Neurological: She is alert and oriented to person, place, and time.  Skin: Skin is warm and dry.  Psychiatric: She has a normal mood and affect. Her behavior is normal. Thought content normal.    MAU Course  Procedures  MDM UA Phenergan 25mg  in D5LR, Tylenol 1gm, 1L MVI: feeling better and ready for d/c  Assessment and Plan  IUP@14 .0wks Probable gastroenteritis  D/C home with rec for advancing diet slowly, increasing fluids as tol Rx Phenergan 12.5mg  tabs F/U @ next visit with Dr Clearance CootsHarper as scheduled  Cam HaiSHAW, Adetokunbo Mccadden CNM 11/06/2015, 8:50 PM

## 2015-11-09 NOTE — L&D Delivery Note (Signed)
Delivery Note This is a 25 year old G 2 P1 who was admitted for Not in labor. and IOL for IUGR, maternal obesity, OSA. She progressed with epidural, Cytotec and pitocin to the second stage of labor.  She pushed for 5 min.  At 1:42 PM she delivered a viable infant female, cephalic, over an intact perineum.  A nuchal cord   was identified. Infant placed somersaulted through and placed on maternal abdomen.  Delayed cord clamping was performed for 5 minutes.  Cord double clamped and cut.  Apgar scores were 8 and 9. The placenta delivered spontaneously, shultz, with a 3 vessel cord.  Inspection revealed none. The uterus was firm bleeding stable.  EBL was 300 ml.    Placenta  was sent for spiraling and IUGR status.  There were no complications during the procedure.  Mom and baby skin to skin following delivery. Left in stable condition.   viable female was delivered via  (Presentation: OA;  ).  APGAR: , ; weight  .   Placenta status: , .  Cord:  with the following complications: .  Cord pH: N/A  Anesthesia: Epidural  Episiotomy:  None Lacerations:  None Suture Repair: none Est. Blood Loss (mL):  300  Mom to postpartum.  Baby to Couplet care / Skin to Skin.  Roe Coombsachelle A Denney, CNM 04/30/2016, 2:00 PM

## 2015-11-11 ENCOUNTER — Encounter: Payer: Medicaid Other | Admitting: Obstetrics

## 2015-11-11 ENCOUNTER — Ambulatory Visit (INDEPENDENT_AMBULATORY_CARE_PROVIDER_SITE_OTHER): Payer: Self-pay | Admitting: Obstetrics

## 2015-11-11 ENCOUNTER — Encounter: Payer: Self-pay | Admitting: Obstetrics

## 2015-11-11 ENCOUNTER — Telehealth: Payer: Self-pay | Admitting: Obstetrics

## 2015-11-11 VITALS — BP 113/78 | HR 110 | Temp 98.6°F | Wt 214.0 lb

## 2015-11-11 DIAGNOSIS — O219 Vomiting of pregnancy, unspecified: Secondary | ICD-10-CM

## 2015-11-11 DIAGNOSIS — Z3482 Encounter for supervision of other normal pregnancy, second trimester: Secondary | ICD-10-CM

## 2015-11-11 LAB — POCT URINALYSIS DIPSTICK
BILIRUBIN UA: NEGATIVE
GLUCOSE UA: NEGATIVE
KETONES UA: NEGATIVE
Leukocytes, UA: NEGATIVE
Nitrite, UA: NEGATIVE
PH UA: 6
Protein, UA: NEGATIVE
RBC UA: 50
Spec Grav, UA: 1.02
Urobilinogen, UA: NEGATIVE

## 2015-11-11 MED ORDER — DOXYLAMINE-PYRIDOXINE 10-10 MG PO TBEC: DELAYED_RELEASE_TABLET | ORAL | Status: DC

## 2015-11-11 NOTE — Progress Notes (Signed)
Nausea is increasing, no relief with Phenergan currently.

## 2015-11-11 NOTE — Telephone Encounter (Signed)
11/11/2015 - Patient's Medicaid will not verify. Ok to see per Victorino DikeJennifer and asked patient to please get Medicaid reactivated before next visit. brm

## 2015-11-12 LAB — AFP, QUAD SCREEN
AFP: 31.9 ng/mL
Curr Gest Age: 14.5 wks.days
Down Syndrome Scr Risk Est: 1:10600 {titer}
HCG, Total: 49.96 IU/mL
INH: 183.1 pg/mL
Interpretation-AFP: NEGATIVE
MOM FOR AFP: 1.43
MOM FOR HCG: 1.13
MoM for INH: 1.28
Open Spina bifida: NEGATIVE
Osb Risk: 1:3430 {titer}
TRI 18 SCR RISK EST: NEGATIVE
UE3 VALUE: 0.38 ng/mL
uE3 Mom: 0.74

## 2015-11-12 NOTE — Progress Notes (Signed)
  Subjective:    Meghan [name] is a 25 y.o. female being seen today for her obstetrical visit. She is at 5336w6d gestation. Patient reports: no complaints.  Problem List Items Addressed This Visit    None    Visit Diagnoses    Encounter for supervision of other normal pregnancy in second trimester    -  Primary    Relevant Orders    AFP, Quad Screen    POCT urinalysis dipstick (Completed)    US MFM OB COMP + 14 WK    Nausea and vomiting in pregnancy prior to [redacted] weeks gestation        Relevant Medications    Doxylamine-Pyridoxine (DICLEGIS) 10-10 MG TBEC      Patient Active Problem List   Diagnosis Date Noted  . Post traumatic stress disorder 03/06/2015  . Headache, migraine 06/27/2013  . NSVD (normal spontaneous vaginal delivery) 03/08/2013  . Hydronephrosis 12/05/2012  . Family planning 06/30/2012  . MDD (major depressive disorder) (HCC) 09/21/2011  . GAD (generalized anxiety disorder) 09/21/2011  . Airway hyperreactivity 11/21/2009    Objective:     BP 113/78 mmHg  Pulse 110  Temp(Src) 98.6 F (37 C)  Wt 214 lb (97.07 kg)  LMP 09/14/2015 Uterine Size: Below umbilicus     Assessment:    Pregnancy @ 6036w6d  weeks Doing well    Plan:    Problem list reviewed and updated. Labs reviewed.  Follow up in 4 weeks. FIRST/CF mutation testing/NIPT/QUAD SCREEN/fragile X/Ashkenazi Jewish population testing/Spinal muscular atrophy discussed: requested. Role of ultrasound in pregnancy discussed; fetal survey: requested. Amniocentesis discussed: not indicated.

## 2015-11-12 NOTE — Telephone Encounter (Signed)
Close encounter 

## 2015-11-26 ENCOUNTER — Other Ambulatory Visit: Payer: Self-pay | Admitting: Certified Nurse Midwife

## 2015-12-08 ENCOUNTER — Encounter: Payer: Self-pay | Admitting: Obstetrics

## 2015-12-11 ENCOUNTER — Ambulatory Visit (HOSPITAL_COMMUNITY)
Admission: RE | Admit: 2015-12-11 | Discharge: 2015-12-11 | Disposition: A | Discharge status: Home / Self Care | Payer: Medicaid Other | Source: Ambulatory Visit | Attending: Obstetrics | Admitting: Obstetrics

## 2015-12-11 ENCOUNTER — Ambulatory Visit (INDEPENDENT_AMBULATORY_CARE_PROVIDER_SITE_OTHER): Payer: Medicaid Other | Admitting: Obstetrics

## 2015-12-11 VITALS — BP 112/75 | HR 97 | Wt 213.0 lb

## 2015-12-11 DIAGNOSIS — Z3A18 18 weeks gestation of pregnancy: Secondary | ICD-10-CM | POA: Diagnosis not present

## 2015-12-11 DIAGNOSIS — Z3482 Encounter for supervision of other normal pregnancy, second trimester: Secondary | ICD-10-CM

## 2015-12-11 DIAGNOSIS — Z36 Encounter for antenatal screening of mother: Secondary | ICD-10-CM | POA: Insufficient documentation

## 2015-12-11 DIAGNOSIS — O99212 Obesity complicating pregnancy, second trimester: Secondary | ICD-10-CM | POA: Insufficient documentation

## 2015-12-12 ENCOUNTER — Encounter: Payer: Self-pay | Admitting: Obstetrics

## 2015-12-12 NOTE — Progress Notes (Signed)
Subjective:    Meghan [name] is a 25 y.o. female being seen today for her obstetrical visit. She is at [redacted]w[redacted]d gestation. Patient reports: no complaints . Fetal movement: normal.  Problem List Items Addressed This Visit    None     Patient Active Problem List   Diagnosis Date Noted  . Post traumatic stress disorder 03/06/2015  . Headache, migraine 06/27/2013  . NSVD (normal spontaneous vaginal delivery) 03/08/2013  . Hydronephrosis 12/05/2012  . Family planning 06/30/2012  . MDD (major depressive disorder) (HCC) 09/21/2011  . GAD (generalized anxiety disorder) 09/21/2011  . Airway hyperreactivity 11/21/2009   Objective:    BP 112/75 mmHg  Pulse 97  Wt 213 lb (96.616 kg)  LMP 09/14/2015 FHT: 150 BPM  Uterine Size: size equals dates     Assessment:    Pregnancy @ [redacted]w[redacted]d    Plan:    Signs and symptoms of preterm labor: discussed.  Labs, problem list reviewed and updated 2 hr GTT planned Follow up in 4 weeks.

## 2015-12-15 ENCOUNTER — Encounter: Payer: Medicaid Other | Admitting: Obstetrics

## 2015-12-15 ENCOUNTER — Ambulatory Visit (HOSPITAL_COMMUNITY): Payer: Medicaid Other

## 2016-01-29 ENCOUNTER — Encounter: Payer: Medicaid Other | Admitting: Obstetrics

## 2016-01-30 ENCOUNTER — Ambulatory Visit (INDEPENDENT_AMBULATORY_CARE_PROVIDER_SITE_OTHER): Payer: Medicaid Other | Admitting: Certified Nurse Midwife

## 2016-01-30 VITALS — BP 112/78 | HR 97 | Temp 98.1°F | Wt 219.0 lb

## 2016-01-30 DIAGNOSIS — O99612 Diseases of the digestive system complicating pregnancy, second trimester: Secondary | ICD-10-CM | POA: Diagnosis not present

## 2016-01-30 DIAGNOSIS — Z87448 Personal history of other diseases of urinary system: Secondary | ICD-10-CM

## 2016-01-30 DIAGNOSIS — O2342 Unspecified infection of urinary tract in pregnancy, second trimester: Secondary | ICD-10-CM

## 2016-01-30 DIAGNOSIS — Z3492 Encounter for supervision of normal pregnancy, unspecified, second trimester: Secondary | ICD-10-CM

## 2016-01-30 DIAGNOSIS — K219 Gastro-esophageal reflux disease without esophagitis: Secondary | ICD-10-CM

## 2016-01-30 LAB — POCT URINALYSIS DIPSTICK
Bilirubin, UA: NEGATIVE
Glucose, UA: NEGATIVE
Ketones, UA: NEGATIVE
LEUKOCYTES UA: NEGATIVE
NITRITE UA: NEGATIVE
PH UA: 7.5
Spec Grav, UA: 1.01
UROBILINOGEN UA: NEGATIVE

## 2016-01-30 MED ORDER — OMEPRAZOLE 20 MG PO CPDR: 20.0000 mg | DELAYED_RELEASE_CAPSULE | Freq: Two times a day (BID) | ORAL | Status: DC

## 2016-01-30 MED ORDER — NITROFURANTOIN MONOHYD MACRO 100 MG PO CAPS: 100.0000 mg | ORAL_CAPSULE | Freq: Two times a day (BID) | ORAL | Status: AC

## 2016-01-30 NOTE — Progress Notes (Signed)
Subjective:    Tivis RingerLakin J Pedro is a 25 y.o. female being seen today for her obstetrical visit. She is at 6125w1d gestation. Patient reports: backache, heartburn, no bleeding, no contractions, no cramping and no leaking . Fetal movement: normal.  Problem List Items Addressed This Visit    None    Visit Diagnoses    Gastroesophageal reflux in pregancy in second trimester    -  Primary    Relevant Medications    omeprazole (PRILOSEC) 20 MG capsule    Prenatal care, second trimester        Relevant Orders    POCT urinalysis dipstick (Completed)    Culture, OB Urine    History of kidney disease        Relevant Orders    Ambulatory referral to Nephrology    UTI in pregnancy, antepartum, second trimester        Relevant Medications    nitrofurantoin, macrocrystal-monohydrate, (MACROBID) 100 MG capsule    Other Relevant Orders    US MFM OB FOLLOW UP      Patient Active Problem List   Diagnosis Date Noted  . Post traumatic stress disorder 03/06/2015  . Headache, migraine 06/27/2013  . NSVD (normal spontaneous vaginal delivery) 03/08/2013  . Hydronephrosis 12/05/2012  . Family planning 06/30/2012  . MDD (major depressive disorder) (HCC) 09/21/2011  . GAD (generalized anxiety disorder) 09/21/2011  . Airway hyperreactivity 11/21/2009   Objective:    BP 112/78 mmHg  Pulse 97  Temp(Src) 98.1 F (36.7 C)  Wt 219 lb (99.338 kg)  LMP 09/14/2015 FHT: 160 BPM  Uterine Size: unable to determine d/t body habitus   NST: reactive for gestational age, + accels, no decels, moderate variability. Cat. 1 tracing, no contractions CVA tenderness  Assessment:    Pregnancy @ 5825w1d    Reactive NST  UTI in pregnancy  H/O kidney issues  ?pyelonephritis  Plan:    OBGCT: discussed. Signs and symptoms of preterm labor: discussed.  Labs, problem list reviewed and updated 2 hr GTT planned Follow up in 2 weeks.

## 2016-02-01 LAB — CULTURE, OB URINE

## 2016-02-01 LAB — URINE CULTURE, OB REFLEX

## 2016-02-02 ENCOUNTER — Ambulatory Visit (HOSPITAL_COMMUNITY)
Admission: RE | Admit: 2016-02-02 | Discharge: 2016-02-02 | Disposition: A | Discharge status: Home / Self Care | Payer: Medicaid Other | Source: Ambulatory Visit | Attending: Certified Nurse Midwife | Admitting: Certified Nurse Midwife

## 2016-02-02 ENCOUNTER — Other Ambulatory Visit: Payer: Self-pay | Admitting: Certified Nurse Midwife

## 2016-02-02 DIAGNOSIS — Z3A26 26 weeks gestation of pregnancy: Secondary | ICD-10-CM

## 2016-02-02 DIAGNOSIS — Z0489 Encounter for examination and observation for other specified reasons: Secondary | ICD-10-CM

## 2016-02-02 DIAGNOSIS — Z8279 Family history of other congenital malformations, deformations and chromosomal abnormalities: Secondary | ICD-10-CM

## 2016-02-02 DIAGNOSIS — O09292 Supervision of pregnancy with other poor reproductive or obstetric history, second trimester: Secondary | ICD-10-CM | POA: Diagnosis not present

## 2016-02-02 DIAGNOSIS — Z36 Encounter for antenatal screening of mother: Secondary | ICD-10-CM | POA: Insufficient documentation

## 2016-02-02 DIAGNOSIS — O2692 Pregnancy related conditions, unspecified, second trimester: Secondary | ICD-10-CM

## 2016-02-02 DIAGNOSIS — IMO0002 Reserved for concepts with insufficient information to code with codable children: Secondary | ICD-10-CM

## 2016-02-02 DIAGNOSIS — O2342 Unspecified infection of urinary tract in pregnancy, second trimester: Secondary | ICD-10-CM

## 2016-02-04 ENCOUNTER — Telehealth: Payer: Self-pay | Admitting: *Deleted

## 2016-02-04 ENCOUNTER — Telehealth: Payer: Self-pay

## 2016-02-04 NOTE — Telephone Encounter (Signed)
Patient was calling for her lab results and also to discuss her anxiety.  In review- her urine culture did not show anything and she still complains of back pain. She states she had a bad anxiety attack at school last night and they had to call someone in. Is there anything she can take for that while she is pregnant? She is not taking anything now. Told patient I would let her provider know and have her call her back.

## 2016-02-04 NOTE — Telephone Encounter (Signed)
CALLED STACY AT [facility] - THEY NEED CBC LABS FOR PATIENT TO SCHEDULE APPT - SENT 2014 FROM HOSPITAL, OTHER THAN THAT, DON'T HAVE - STACY TRYING TO GET FROM PATIENT'S PCP

## 2016-02-05 ENCOUNTER — Other Ambulatory Visit: Payer: Self-pay | Admitting: Certified Nurse Midwife

## 2016-02-05 DIAGNOSIS — F5105 Insomnia due to other mental disorder: Secondary | ICD-10-CM

## 2016-02-05 DIAGNOSIS — F411 Generalized anxiety disorder: Secondary | ICD-10-CM

## 2016-02-05 DIAGNOSIS — F331 Major depressive disorder, recurrent, moderate: Secondary | ICD-10-CM

## 2016-02-05 DIAGNOSIS — F4310 Post-traumatic stress disorder, unspecified: Secondary | ICD-10-CM

## 2016-02-05 MED ORDER — BUSPIRONE HCL 7.5 MG PO TABS: 7.5000 mg | ORAL_TABLET | Freq: Two times a day (BID) | ORAL | Status: DC

## 2016-02-05 MED ORDER — FLUOXETINE HCL 40 MG PO CAPS: 40.0000 mg | ORAL_CAPSULE | Freq: Every day | ORAL | Status: DC

## 2016-02-05 NOTE — Progress Notes (Signed)
States that she is having really bad anxiety and depression.  Buspar and Prozac.  Patient states that her provider left the [facility] system.  Desires to see another psychiatrist/psychologist.  Referals placed.  Patient verbalized understanding.

## 2016-02-13 ENCOUNTER — Ambulatory Visit (INDEPENDENT_AMBULATORY_CARE_PROVIDER_SITE_OTHER): Payer: Medicaid Other | Admitting: Certified Nurse Midwife

## 2016-02-13 ENCOUNTER — Other Ambulatory Visit: Payer: Medicaid Other

## 2016-02-13 VITALS — BP 115/72 | HR 116 | Temp 99.0°F | Wt 217.0 lb

## 2016-02-13 DIAGNOSIS — J40 Bronchitis, not specified as acute or chronic: Secondary | ICD-10-CM

## 2016-02-13 DIAGNOSIS — J0110 Acute frontal sinusitis, unspecified: Secondary | ICD-10-CM

## 2016-02-13 DIAGNOSIS — Z3493 Encounter for supervision of normal pregnancy, unspecified, third trimester: Secondary | ICD-10-CM

## 2016-02-13 LAB — POCT URINALYSIS DIPSTICK
BILIRUBIN UA: NEGATIVE
Blood, UA: NEGATIVE
Glucose, UA: NEGATIVE
KETONES UA: NEGATIVE
Leukocytes, UA: NEGATIVE
Nitrite, UA: NEGATIVE
SPEC GRAV UA: 1.015
Urobilinogen, UA: NEGATIVE
pH, UA: 6

## 2016-02-13 MED ORDER — PSEUDOEPH-BROMPHEN-DM 30-2-10 MG/5ML PO SYRP: 5.0000 mL | ORAL_SOLUTION | Freq: Four times a day (QID) | ORAL | Status: DC | PRN

## 2016-02-13 MED ORDER — LORATADINE-PSEUDOEPHEDRINE ER 10-240 MG PO TB24: 1.0000 | ORAL_TABLET | Freq: Every day | ORAL | Status: DC

## 2016-02-13 MED ORDER — DIPHENHYDRAMINE HCL 25 MG PO TABS: 50.0000 mg | ORAL_TABLET | Freq: Every day | ORAL | Status: DC

## 2016-02-13 MED ORDER — AMOXICILLIN-POT CLAVULANATE 875-125 MG PO TABS: 1.0000 | ORAL_TABLET | Freq: Two times a day (BID) | ORAL | Status: AC

## 2016-02-13 NOTE — Progress Notes (Signed)
Subjective:    Meghan [name] is a 25 y.o. female being seen today for her obstetrical visit. She is at 3949w1d gestation. Patient reports backache, no bleeding, no cramping, no leaking and occasional contractions. Fetal movement: normal.  Reports URI for 3 weeks, has cough.  Lungs CTA bilaterally.  Sinus tenderness present along maxillary.  Reports post nasal drip and chills, denies fever.    Problem List Items Addressed This Visit    None    Visit Diagnoses    Prenatal care, third trimester    -  Primary    Relevant Orders    POCT urinalysis dipstick (Completed)    US MFM OB FOLLOW UP    Bronchitis        Relevant Medications    amoxicillin-clavulanate (AUGMENTIN) 875-125 MG tablet    Acute frontal sinusitis, recurrence not specified        Relevant Medications    amoxicillin-clavulanate (AUGMENTIN) 875-125 MG tablet    diphenhydrAMINE (BENADRYL) 25 MG tablet    brompheniramine-pseudoephedrine-DM 30-2-10 MG/5ML syrup    loratadine-pseudoephedrine (CLARITIN-D 24 HOUR) 10-240 MG 24 hr tablet      Patient Active Problem List   Diagnosis Date Noted  . Post traumatic stress disorder 03/06/2015  . Headache, migraine 06/27/2013  . NSVD (normal spontaneous vaginal delivery) 03/08/2013  . Hydronephrosis 12/05/2012  . Family planning 06/30/2012  . MDD (major depressive disorder) (HCC) 09/21/2011  . GAD (generalized anxiety disorder) 09/21/2011  . 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 GBS planning TDAP offered  Rhogam given for RH negative Pediatrician: discussed. Infant feeding: plans to  breastfeed. Maternity leave: discussed. Cigarette smoking: never smoked. Orders Placed This Encounter  Procedures  . US MFM OB FOLLOW UP    Standing Status: Future     Number of Occurrences:      Standing Expiration Date: 04/14/2017    Order Specific Question:  Reason for Exam (SYMPTOM  OR DIAGNOSIS REQUIRED)    Answer:  fetal kidney issues, f/u fetal growth, h/o of IUGR    Order Specific Question:  Preferred imaging location?    Answer:  [facility]Ul[facility]  . POCT urinalysis dipstick   Meds ordered this encounter  Medications  . amoxicillin-clavulanate (AUGMENTIN) 875-125 MG tablet    Sig: Take 1 tablet by mouth 2 (two) times daily.    Dispense:  14 tablet    Refill:  0  . diphenhydrAMINE (BENADRYL) 25 MG tablet    Sig: Take 2 tablets (50 mg total) by mouth at bedtime.    Dispense:  90 tablet    Refill:  4  . brompheniramine-pseudoephedrine-DM 30-2-10 MG/5ML syrup    Sig: Take 5 mLs by mouth 4 (four) times daily as needed.    Dispense:  120 mL    Refill:  0  . loratadine-pseudoephedrine (CLARITIN-D 24 HOUR) 10-240 MG 24 hr tablet    Sig: Take 1 tablet by mouth daily.    Dispense:  30 tablet    Refill:  12   Follow up in 2 Weeks.

## 2016-02-13 NOTE — Progress Notes (Signed)
Patient states she is having a lot lower back pain

## 2016-02-13 NOTE — Addendum Note (Signed)
Addended by: Marya LandryFOSTER, SUZANNE D on: 02/13/2016 01:45 PM   Modules accepted: Orders

## 2016-02-14 LAB — GLUCOSE TOLERANCE, 2 HOURS W/ 1HR
GLUCOSE, FASTING: 82 mg/dL (ref 65–91)
Glucose, 1 hour: 143 mg/dL (ref 65–179)
Glucose, 2 hour: 115 mg/dL (ref 65–152)

## 2016-02-14 LAB — CBC
HEMATOCRIT: 33.2 % — AB (ref 34.0–46.6)
Hemoglobin: 11 g/dL — ABNORMAL LOW (ref 11.1–15.9)
MCH: 28.4 pg (ref 26.6–33.0)
MCHC: 33.1 g/dL (ref 31.5–35.7)
MCV: 86 fL (ref 79–97)
PLATELETS: 189 10*3/uL (ref 150–379)
RBC: 3.88 x10E6/uL (ref 3.77–5.28)
RDW: 14.2 % (ref 12.3–15.4)
WBC: 9.2 10*3/uL (ref 3.4–10.8)

## 2016-02-14 LAB — HIV ANTIBODY (ROUTINE TESTING W REFLEX): HIV Screen 4th Generation wRfx: NONREACTIVE

## 2016-02-14 LAB — RPR: RPR Ser Ql: NONREACTIVE

## 2016-02-18 ENCOUNTER — Other Ambulatory Visit: Payer: Self-pay | Admitting: Certified Nurse Midwife

## 2016-02-18 DIAGNOSIS — Z87448 Personal history of other diseases of urinary system: Secondary | ICD-10-CM

## 2016-02-20 ENCOUNTER — Other Ambulatory Visit: Payer: Self-pay | Admitting: Certified Nurse Midwife

## 2016-02-20 ENCOUNTER — Ambulatory Visit (HOSPITAL_COMMUNITY)
Admission: RE | Admit: 2016-02-20 | Discharge: 2016-02-20 | Disposition: A | Discharge status: Home / Self Care | Payer: Medicaid Other | Source: Ambulatory Visit | Attending: Certified Nurse Midwife | Admitting: Certified Nurse Midwife

## 2016-02-20 DIAGNOSIS — O09293 Supervision of pregnancy with other poor reproductive or obstetric history, third trimester: Secondary | ICD-10-CM

## 2016-02-20 DIAGNOSIS — Z8279 Family history of other congenital malformations, deformations and chromosomal abnormalities: Secondary | ICD-10-CM

## 2016-02-20 DIAGNOSIS — O26833 Pregnancy related renal disease, third trimester: Secondary | ICD-10-CM

## 2016-02-20 DIAGNOSIS — Z3A29 29 weeks gestation of pregnancy: Secondary | ICD-10-CM | POA: Insufficient documentation

## 2016-02-20 DIAGNOSIS — Z3493 Encounter for supervision of normal pregnancy, unspecified, third trimester: Secondary | ICD-10-CM

## 2016-02-20 DIAGNOSIS — O99213 Obesity complicating pregnancy, third trimester: Secondary | ICD-10-CM

## 2016-02-23 ENCOUNTER — Other Ambulatory Visit (HOSPITAL_COMMUNITY): Payer: Self-pay | Admitting: *Deleted

## 2016-02-23 DIAGNOSIS — IMO0002 Reserved for concepts with insufficient information to code with codable children: Secondary | ICD-10-CM

## 2016-02-24 ENCOUNTER — Other Ambulatory Visit: Payer: Self-pay | Admitting: Certified Nurse Midwife

## 2016-02-26 ENCOUNTER — Ambulatory Visit (INDEPENDENT_AMBULATORY_CARE_PROVIDER_SITE_OTHER): Payer: Medicaid Other | Admitting: Certified Nurse Midwife

## 2016-02-26 ENCOUNTER — Encounter (HOSPITAL_COMMUNITY): Payer: Self-pay

## 2016-02-26 ENCOUNTER — Inpatient Hospital Stay (HOSPITAL_COMMUNITY)
Admission: AD | Admit: 2016-02-26 | Discharge: 2016-02-26 | Disposition: A | Discharge status: Home / Self Care | Payer: Medicaid Other | Source: Ambulatory Visit | Attending: Obstetrics | Admitting: Obstetrics

## 2016-02-26 ENCOUNTER — Other Ambulatory Visit: Payer: Medicaid Other

## 2016-02-26 ENCOUNTER — Inpatient Hospital Stay (HOSPITAL_COMMUNITY): Payer: Medicaid Other

## 2016-02-26 VITALS — BP 125/83 | HR 82 | Wt 218.0 lb

## 2016-02-26 DIAGNOSIS — J45909 Unspecified asthma, uncomplicated: Secondary | ICD-10-CM | POA: Insufficient documentation

## 2016-02-26 DIAGNOSIS — F4310 Post-traumatic stress disorder, unspecified: Secondary | ICD-10-CM | POA: Diagnosis not present

## 2016-02-26 DIAGNOSIS — M533 Sacrococcygeal disorders, not elsewhere classified: Secondary | ICD-10-CM | POA: Insufficient documentation

## 2016-02-26 DIAGNOSIS — R319 Hematuria, unspecified: Secondary | ICD-10-CM

## 2016-02-26 DIAGNOSIS — W182XXA Fall in (into) shower or empty bathtub, initial encounter: Secondary | ICD-10-CM | POA: Diagnosis not present

## 2016-02-26 DIAGNOSIS — F419 Anxiety disorder, unspecified: Secondary | ICD-10-CM | POA: Insufficient documentation

## 2016-02-26 DIAGNOSIS — O26893 Other specified pregnancy related conditions, third trimester: Secondary | ICD-10-CM | POA: Insufficient documentation

## 2016-02-26 DIAGNOSIS — Z3493 Encounter for supervision of normal pregnancy, unspecified, third trimester: Secondary | ICD-10-CM

## 2016-02-26 DIAGNOSIS — F329 Major depressive disorder, single episode, unspecified: Secondary | ICD-10-CM | POA: Insufficient documentation

## 2016-02-26 DIAGNOSIS — O9A213 Injury, poisoning and certain other consequences of external causes complicating pregnancy, third trimester: Secondary | ICD-10-CM | POA: Diagnosis not present

## 2016-02-26 DIAGNOSIS — F909 Attention-deficit hyperactivity disorder, unspecified type: Secondary | ICD-10-CM | POA: Diagnosis not present

## 2016-02-26 DIAGNOSIS — Z3A30 30 weeks gestation of pregnancy: Secondary | ICD-10-CM

## 2016-02-26 DIAGNOSIS — W19XXXA Unspecified fall, initial encounter: Secondary | ICD-10-CM

## 2016-02-26 DIAGNOSIS — Y92002 Bathroom of unspecified non-institutional (private) residence single-family (private) house as the place of occurrence of the external cause: Secondary | ICD-10-CM | POA: Diagnosis not present

## 2016-02-26 DIAGNOSIS — Z87448 Personal history of other diseases of urinary system: Secondary | ICD-10-CM

## 2016-02-26 DIAGNOSIS — S3992XA Unspecified injury of lower back, initial encounter: Secondary | ICD-10-CM | POA: Diagnosis not present

## 2016-02-26 DIAGNOSIS — Y92009 Unspecified place in unspecified non-institutional (private) residence as the place of occurrence of the external cause: Secondary | ICD-10-CM

## 2016-02-26 LAB — POCT URINALYSIS DIPSTICK
Bilirubin, UA: NEGATIVE
Glucose, UA: NEGATIVE
KETONES UA: NEGATIVE
Leukocytes, UA: NEGATIVE
Nitrite, UA: NEGATIVE
PH UA: 8
PROTEIN UA: NEGATIVE
SPEC GRAV UA: 1.015
UROBILINOGEN UA: NEGATIVE

## 2016-02-26 MED ORDER — OXYCODONE-ACETAMINOPHEN 5-325 MG PO TABS: 1.0000 | ORAL_TABLET | Freq: Four times a day (QID) | ORAL | Status: DC | PRN

## 2016-02-26 MED ORDER — HYDROCODONE-ACETAMINOPHEN 5-325 MG PO TABS
2.0000 | ORAL_TABLET | Freq: Once | ORAL | Status: AC
Start: 2016-02-26 — End: 2016-02-26
  Administered 2016-02-26: 2 via ORAL
  Filled 2016-02-26: qty 2

## 2016-02-26 MED ORDER — FAMOTIDINE 20 MG PO TABS
40.0000 mg | ORAL_TABLET | Freq: Once | ORAL | Status: AC
  Administered 2016-02-26: 40 mg via ORAL
  Filled 2016-02-26: qty 2

## 2016-02-26 NOTE — MAU Note (Signed)
Pt was in shower around 10am and fell onto her bottom. Pt did not hit her stomach. Pt denies bleeding and leaking of fluid. Pt states baby is moving normally. Pt states she is having pain at her tailbone.

## 2016-02-26 NOTE — Discharge Instructions (Signed)
Fetal Movement Counts °Patient Name: __________________________________________________ Patient Due Date: ____________________ °Performing a fetal movement count is highly recommended in high-risk pregnancies, but it is good for every pregnant woman to do. Your health care provider may ask you to start counting fetal movements at 28 weeks of the pregnancy. Fetal movements often increase: °· After eating a full meal. °· After physical activity. °· After eating or drinking something sweet or cold. °· At rest. °Pay attention to when you feel the baby is most active. This will help you notice a pattern of your baby's sleep and wake cycles and what factors contribute to an increase in fetal movement. It is important to perform a fetal movement count at the same time each day when your baby is normally most active.  °HOW TO COUNT FETAL MOVEMENTS °· Find a quiet and comfortable area to sit or lie down on your left side. Lying on your left side provides the best blood and oxygen circulation to your baby. °· Write down the day and time on a sheet of paper or in a journal. °· Start counting kicks, flutters, swishes, rolls, or jabs in a 2-hour period. You should feel at least 10 movements within 2 hours. °· If you do not feel 10 movements in 2 hours, wait 2-3 hours and count again. Look for a change in the pattern or not enough counts in 2 hours. °SEEK MEDICAL CARE IF: °· You feel less than 10 counts in 2 hours, tried twice. °· There is no movement in over an hour. °· The pattern is changing or taking longer each day to reach 10 counts in 2 hours. °· You feel the baby is not moving as he or she usually does. °Date: ____________ Movements: ____________ Start time: ____________ Finish time: ____________  °Date: ____________ Movements: ____________ Start time: ____________ Finish time: ____________ °Date: ____________ Movements: ____________ Start time: ____________ Finish time: ____________ °Date: ____________ Movements:  ____________ Start time: ____________ Finish time: ____________ °Date: ____________ Movements: ____________ Start time: ____________ Finish time: ____________ °Date: ____________ Movements: ____________ Start time: ____________ Finish time: ____________ °Date: ____________ Movements: ____________ Start time: ____________ Finish time: ____________ °Date: ____________ Movements: ____________ Start time: ____________ Finish time: ____________  °Date: ____________ Movements: ____________ Start time: ____________ Finish time: ____________ °Date: ____________ Movements: ____________ Start time: ____________ Finish time: ____________ °Date: ____________ Movements: ____________ Start time: ____________ Finish time: ____________ °Date: ____________ Movements: ____________ Start time: ____________ Finish time: ____________ °Date: ____________ Movements: ____________ Start time: ____________ Finish time: ____________ °Date: ____________ Movements: ____________ Start time: ____________ Finish time: ____________ °Date: ____________ Movements: ____________ Start time: ____________ Finish time: ____________  °Date: ____________ Movements: ____________ Start time: ____________ Finish time: ____________ °Date: ____________ Movements: ____________ Start time: ____________ Finish time: ____________ °Date: ____________ Movements: ____________ Start time: ____________ Finish time: ____________ °Date: ____________ Movements: ____________ Start time: ____________ Finish time: ____________ °Date: ____________ Movements: ____________ Start time: ____________ Finish time: ____________ °Date: ____________ Movements: ____________ Start time: ____________ Finish time: ____________ °Date: ____________ Movements: ____________ Start time: ____________ Finish time: ____________  °Date: ____________ Movements: ____________ Start time: ____________ Finish time: ____________ °Date: ____________ Movements: ____________ Start time: ____________ Finish  time: ____________ °Date: ____________ Movements: ____________ Start time: ____________ Finish time: ____________ °Date: ____________ Movements: ____________ Start time: ____________ Finish time: ____________ °Date: ____________ Movements: ____________ Start time: ____________ Finish time: ____________ °Date: ____________ Movements: ____________ Start time: ____________ Finish time: ____________ °Date: ____________ Movements: ____________ Start time: ____________ Finish time: ____________  °Date: ____________ Movements: ____________ Start time: ____________ Finish   time: ____________ °Date: ____________ Movements: ____________ Start time: ____________ Finish time: ____________ °Date: ____________ Movements: ____________ Start time: ____________ Finish time: ____________ °Date: ____________ Movements: ____________ Start time: ____________ Finish time: ____________ °Date: ____________ Movements: ____________ Start time: ____________ Finish time: ____________ °Date: ____________ Movements: ____________ Start time: ____________ Finish time: ____________ °Date: ____________ Movements: ____________ Start time: ____________ Finish time: ____________  °Date: ____________ Movements: ____________ Start time: ____________ Finish time: ____________ °Date: ____________ Movements: ____________ Start time: ____________ Finish time: ____________ °Date: ____________ Movements: ____________ Start time: ____________ Finish time: ____________ °Date: ____________ Movements: ____________ Start time: ____________ Finish time: ____________ °Date: ____________ Movements: ____________ Start time: ____________ Finish time: ____________ °Date: ____________ Movements: ____________ Start time: ____________ Finish time: ____________ °Date: ____________ Movements: ____________ Start time: ____________ Finish time: ____________  °Date: ____________ Movements: ____________ Start time: ____________ Finish time: ____________ °Date: ____________  Movements: ____________ Start time: ____________ Finish time: ____________ °Date: ____________ Movements: ____________ Start time: ____________ Finish time: ____________ °Date: ____________ Movements: ____________ Start time: ____________ Finish time: ____________ °Date: ____________ Movements: ____________ Start time: ____________ Finish time: ____________ °Date: ____________ Movements: ____________ Start time: ____________ Finish time: ____________ °Date: ____________ Movements: ____________ Start time: ____________ Finish time: ____________  °Date: ____________ Movements: ____________ Start time: ____________ Finish time: ____________ °Date: ____________ Movements: ____________ Start time: ____________ Finish time: ____________ °Date: ____________ Movements: ____________ Start time: ____________ Finish time: ____________ °Date: ____________ Movements: ____________ Start time: ____________ Finish time: ____________ °Date: ____________ Movements: ____________ Start time: ____________ Finish time: ____________ °Date: ____________ Movements: ____________ Start time: ____________ Finish time: ____________ °  °This information is not intended to replace advice given to you by your health care provider. Make sure you discuss any questions you have with your health care provider. °  °Document Released: 11/24/2006 Document Revised: 11/15/2014 Document Reviewed: 08/21/2012 °Elsevier Interactive Patient Education ©2016 [employer] °Third Trimester of Pregnancy °The third trimester is from week 29 through week 42, months 7 through 9. The third trimester is a time when the fetus is growing rapidly. At the end of the ninth month, the fetus is about 20 inches in length and weighs 6-10 pounds.  °BODY CHANGES °Your body goes through many changes during pregnancy. The changes vary from woman to woman.  °· Your weight will continue to increase. You can expect to gain 25-35 pounds (11-16 kg) by the end of the pregnancy. °· You may  begin to get stretch marks on your hips, abdomen, and breasts. °· You may urinate more often because the fetus is moving lower into your pelvis and pressing on your bladder. °· You may develop or continue to have heartburn as a result of your pregnancy. °· You may develop constipation because certain hormones are causing the muscles that push waste through your intestines to slow down. °· You may develop hemorrhoids or swollen, bulging veins (varicose veins). °· You may have pelvic pain because of the weight gain and pregnancy hormones relaxing your joints between the bones in your pelvis. Backaches may result from overexertion of the muscles supporting your posture. °· You may have changes in your hair. These can include thickening of your hair, rapid growth, and changes in texture. Some women also have hair loss during or after pregnancy, or hair that feels dry or thin. Your hair will most likely return to normal after your baby is born. °· Your breasts will continue to grow and be tender. A yellow discharge may leak from your breasts called colostrum. °· Your belly button may stick out. °·   You may feel short of breath because of your expanding uterus.  You may notice the fetus "dropping," or moving lower in your abdomen.  You may have a bloody mucus discharge. This usually occurs a few days to a week before labor begins.  Your cervix becomes thin and soft (effaced) near your due date. WHAT TO EXPECT AT YOUR PRENATAL EXAMS  You will have prenatal exams every 2 weeks until week 36. Then, you will have weekly prenatal exams. During a routine prenatal visit:  You will be weighed to make sure you and the fetus are growing normally.  Your blood pressure is taken.  Your abdomen will be measured to track your baby's growth.  The fetal heartbeat will be listened to.  Any test results from the previous visit will be discussed.  You may have a cervical check near your due date to see if you have  effaced. At around 36 weeks, your caregiver will check your cervix. At the same time, your caregiver will also perform a test on the secretions of the vaginal tissue. This test is to determine if a type of bacteria, Group B streptococcus, is present. Your caregiver will explain this further. Your caregiver may ask you:  What your birth plan is.  How you are feeling.  If you are feeling the baby move.  If you have had any abnormal symptoms, such as leaking fluid, bleeding, severe headaches, or abdominal cramping.  If you are using any tobacco products, including cigarettes, chewing tobacco, and electronic cigarettes.  If you have any questions. Other tests or screenings that may be performed during your third trimester include:  Blood tests that check for low iron levels (anemia).  Fetal testing to check the health, activity level, and growth of the fetus. Testing is done if you have certain medical conditions or if there are problems during the pregnancy.  HIV (human immunodeficiency virus) testing. If you are at high risk, you may be screened for HIV during your third trimester of pregnancy. FALSE LABOR You may feel small, irregular contractions that eventually go away. These are called Braxton Hicks contractions, or false labor. Contractions may last for hours, days, or even weeks before true labor sets in. If contractions come at regular intervals, intensify, or become painful, it is best to be seen by your caregiver.  SIGNS OF LABOR   Menstrual-like cramps.  Contractions that are 5 minutes apart or less.  Contractions that start on the top of the uterus and spread down to the lower abdomen and back.  A sense of increased pelvic pressure or back pain.  A watery or bloody mucus discharge that comes from the vagina. If you have any of these signs before the 37th week of pregnancy, call your caregiver right away. You need to go to the hospital to get checked immediately. HOME CARE  INSTRUCTIONS   Avoid all smoking, herbs, alcohol, and unprescribed drugs. These chemicals affect the formation and growth of the baby.  Do not use any tobacco products, including cigarettes, chewing tobacco, and electronic cigarettes. If you need help quitting, ask your health care provider. You may receive counseling support and other resources to help you quit.  Follow your caregiver's instructions regarding medicine use. There are medicines that are either safe or unsafe to take during pregnancy.  Exercise only as directed by your caregiver. Experiencing uterine cramps is a good sign to stop exercising.  Continue to eat regular, healthy meals.  Wear a good support bra for  breast tenderness. °· Do not use hot tubs, steam rooms, or saunas. °· Wear your seat belt at all times when driving. °· Avoid raw meat, uncooked cheese, cat litter boxes, and soil used by cats. These carry germs that can cause birth defects in the baby. °· Take your prenatal vitamins. °· Take 1500-2000 mg of calcium daily starting at the 20th week of pregnancy until you deliver your baby. °· Try taking a stool softener (if your caregiver approves) if you develop constipation. Eat more high-fiber foods, such as fresh vegetables or fruit and whole grains. Drink plenty of fluids to keep your urine clear or pale yellow. °· Take warm sitz baths to soothe any pain or discomfort caused by hemorrhoids. Use hemorrhoid cream if your caregiver approves. °· If you develop varicose veins, wear support hose. Elevate your feet for 15 minutes, 3-4 times a day. Limit salt in your diet. °· Avoid heavy lifting, wear low heal shoes, and practice good posture. °· Rest a lot with your legs elevated if you have leg cramps or low back pain. °· Visit your dentist if you have not gone during your pregnancy. Use a soft toothbrush to brush your teeth and be gentle when you floss. °· A sexual relationship may be continued unless your caregiver directs you  otherwise. °· Do not travel far distances unless it is absolutely necessary and only with the approval of your caregiver. °· Take prenatal classes to understand, practice, and ask questions about the labor and delivery. °· Make a trial run to the hospital. °· Pack your hospital bag. °· Prepare the baby's nursery. °· Continue to go to all your prenatal visits as directed by your caregiver. °SEEK MEDICAL CARE IF: °· You are unsure if you are in labor or if your water has broken. °· You have dizziness. °· You have mild pelvic cramps, pelvic pressure, or nagging pain in your abdominal area. °· You have persistent nausea, vomiting, or diarrhea. °· You have a bad smelling vaginal discharge. °· You have pain with urination. °SEEK IMMEDIATE MEDICAL CARE IF:  °· You have a fever. °· You are leaking fluid from your vagina. °· You have spotting or bleeding from your vagina. °· You have severe abdominal cramping or pain. °· You have rapid weight loss or gain. °· You have shortness of breath with chest pain. °· You notice sudden or extreme swelling of your face, hands, ankles, feet, or legs. °· You have not felt your baby move in over an hour. °· You have severe headaches that do not go away with medicine. °· You have vision changes. °  °This information is not intended to replace advice given to you by your health care provider. Make sure you discuss any questions you have with your health care provider. °  °Document Released: 10/19/2001 Document Revised: 11/15/2014 Document Reviewed: 12/26/2012 °Elsevier Interactive Patient Education ©2016 [employer] ° °

## 2016-02-26 NOTE — Progress Notes (Signed)
Subjective:    Meghan [name] is a 25 y.o. female being seen today for her obstetrical visit. She is at 64w0dgestation. Patient reports backache and fell this AM, reports +FM, denies any cramping or vaginal bleeding since fall. Fetal movement: normal.  Problem List Items Addressed This Visit    None    Visit Diagnoses    Prenatal care, third trimester    -  Primary    Relevant Orders    POCT urinalysis dipstick (Completed)    Comp Met (CMET)    Hematuria        Relevant Orders    Culture, OB Urine      Patient Active Problem List   Diagnosis Date Noted  . Post traumatic stress disorder 03/06/2015  . Headache, migraine 06/27/2013  . NSVD (normal spontaneous vaginal delivery) 03/08/2013  . Hydronephrosis 12/05/2012  . Family planning 06/30/2012  . [place]  . GAD (generalized anxiety disorder) 09/21/2011  . 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  Dr. HJodi Mourningnotified.  Cat. 2 tracing at best.  No contractions on TOCO.    Assessment:    Pregnancy @ 379w0deeks   Fall in pregnancy  Non reactive NST: +variables  Hematuria  Chronic Kidney issues: Nephrology consult pending  Plan:    Sent to MAU for evaluation post fall   labs reviewed, problem list updated Consent signed. GBS planning TDAP offered  Rhogam given for RH negative Pediatrician: discussed. Infant feeding: plans to breastfeed. Maternity leave: discussed. Cigarette smoking: never smoked. Orders Placed This Encounter  Procedures  . Culture, OB Urine  . Comp Met (CMET)  . POCT urinalysis dipstick   No orders of the defined types were placed in this encounter.   Follow up in 2 Weeks.

## 2016-02-26 NOTE — MAU Provider Note (Signed)
History     CSN: [id number]  Arrival date and time: 02/26/16 1727   None     Chief Complaint  Patient presents with  . Fall   HPI    [name] is a 25 y.o. female G2P1001 at [redacted]w[redacted]d presenting to MAU following a fall she experienced this morning while in the shower.  She was taking a shower and slipped and fell and landed on her bottom.  Right away she started experiencing pain at her tailbone. She went to her scheduled OB appointment today and was referred her for further monitoring.   She denies vaginal bleeding She denies abdominal pain  She denies gushes of fluid.   OB History    Gravida Para Term Preterm AB TAB SAB Ectopic Multiple Living   0 0 0 0 0 0 1      Past Medical History  Diagnosis Date  . Anxiety   . Depression   . Headache(784.0)   . Obesity (BMI 30-39.9)     Pt  doesn't  "eat much"  does not eat veggies/fruit  . ADHD (attention deficit hyperactivity disorder)   . Chronic kidney disease     kidney stones  . Asthma   . Post traumatic stress disorder 03/06/2015  . PTSD (post-traumatic stress disorder)     Past Surgical History  Procedure Laterality Date  . Wisdom tooth extraction    . Cholecystectomy  2015  . Tonsillectomy  2016    Family History  Problem Relation Age of Onset  . Bipolar disorder Mother   . Anxiety disorder Mother   . Hypertension Mother   . Bipolar disorder Maternal Grandmother   . Hypertension Maternal Grandmother   . Depression Cousin   . Bipolar disorder Other   . Hypertension Maternal Grandfather     Social History  Substance Use Topics  . Smoking status: Never Smoker   . Smokeless tobacco: Never Used  . Alcohol Use: No    Allergies: No Known Allergies  Prescriptions prior to admission  Medication Sig Dispense Refill Last Dose  . albuterol (PROVENTIL HFA;VENTOLIN HFA) 108 (90 BASE) MCG/ACT inhaler Inhale 2 puffs into the lungs every 4 (four) hours as needed for wheezing or shortness of breath.  Reported on 02/13/2016   Taking  . busPIRone (BUSPAR) 7.5 MG tablet Take 1 tablet (7.5 mg total) by mouth 2 (two) times daily. 60 tablet 6 Taking  . calcium carbonate (TUMS - DOSED IN MG ELEMENTAL CALCIUM) 500 MG chewable tablet Chew 2 tablets by mouth as needed. For heartburn   Taking  . diphenhydrAMINE (BENADRYL) 25 MG tablet Take 2 tablets (50 mg total) by mouth at bedtime. 90 tablet 4 Taking  . Doxylamine-Pyridoxine (DICLEGIS) 10-10 MG TBEC 1 tab in AM, 1 tab mid afternoon 2 tabs at bedtime. Max dose 4 tabs daily. 100 tablet 5 Taking  . FLUoxetine (PROZAC) 40 MG capsule Take 1 capsule (40 mg total) by mouth daily. 30 capsule 6 Taking  . hydrOXYzine (ATARAX/VISTARIL) 25 MG tablet Take 1 tablet (25 mg total) by mouth at bedtime as needed for anxiety. 30 tablet 1 Taking  . loratadine-pseudoephedrine (CLARITIN-D 24 HOUR) 10-240 MG 24 hr tablet Take 1 tablet by mouth daily. 30 tablet 12 Taking  . omeprazole (PRILOSEC) 20 MG capsule Take 1 capsule (20 mg total) by mouth 2 (two) times daily before a meal. 60 capsule 6 Taking  . Prenatal Vit-Fe Fumarate-FA (PRENATAL MULTIVITAMIN) TABS Take 1 tablet by mouth at bedtime.  Taking   Results for orders placed or performed in visit on 02/26/16 (from the past 48 hour(s))  POCT urinalysis dipstick     Status: None   Collection Time: 02/26/16  4:25 PM  Result Value Ref Range   Color, UA yellow    Clarity, UA cloudy    Glucose, UA neg    Bilirubin, UA neg    Ketones, UA neg    Spec Grav, UA 1.015    Blood, UA trace    pH, UA 8.0    Protein, UA neg    Urobilinogen, UA negative    Nitrite, UA neg    Leukocytes, UA Negative Negative    Review of Systems  Gastrointestinal: Positive for heartburn. Negative for abdominal pain.  Genitourinary:       Denies vaginal bleeding   Musculoskeletal: Positive for joint pain (Tailbone pain. ).   Physical Exam   Blood pressure 115/72, pulse 91, temperature 98 F (36.7 C), temperature source Oral, resp. rate 18,  height 5' (1.524 m), weight 218 lb (98.884 kg), last menstrual period 09/14/2015, unknown if currently breastfeeding.  Physical Exam  Constitutional: She is oriented to person, place, and time. She appears well-developed and well-nourished. No distress.  HENT:  Head: Normocephalic.  GI: Soft. She exhibits no distension. There is no tenderness. There is no rebound.  Genitourinary:  Cervix:closed/thick/ballotable  Musculoskeletal:       Lumbar back: She exhibits tenderness and pain. She exhibits no swelling and no edema.       Back:       Arms: Neurological: She is alert and oriented to person, place, and time.  Skin: Skin is warm. She is not diaphoretic.  Psychiatric: Her behavior is normal.    Fetal Tracing: Baseline: 140 bpm  Variability: moderate  Accelerations: 15x15 Decelerations: None Toco: UI initially; none now.   Results for orders placed or performed in visit on 02/26/16 (from the past 24 hour(s))  POCT urinalysis dipstick     Status: None   Collection Time: 02/26/16  4:25 PM  Result Value Ref Range   Color, UA yellow    Clarity, UA cloudy    Glucose, UA neg    Bilirubin, UA neg    Ketones, UA neg    Spec Grav, UA 1.015    Blood, UA trace    pH, UA 8.0    Protein, UA neg    Urobilinogen, UA negative    Nitrite, UA neg    Leukocytes, UA Negative Negative   Dg Sacrum/coccyx  02/26/2016  CLINICAL DATA:  Fall with pain at the coccyx. Thirty weeks pregnant. Initial encounter. EXAM: SACRUM AND COCCYX - 2+ VIEW COMPARISON:  None. FINDINGS: Normal lateral image of the sacrum and coccyx. The other visualized osseous structures are also normal. Frontal imaging was not obtained due to patient's gravid state and site of tenderness. Child is likely laying cephalic. IMPRESSION: Negative. Electronically Signed   By: Marnee SpringJonathon  Watts M.D.   On: 02/26/2016 20:03    MAU Course  Procedures  None  MDM  NST Vicodin 2 tabs Xray of coccyx/ sacrum. Patient signed consent.    Report given to Thressa ShellerHeather Hogan CNM who resumes care of the patient.   Duane LopeJennifer I Rasch, NP 02/26/2016 8:04 PM  2033: FHR tracing remains reactive (>4 hours since time of fall). Toco shows no UCs. Sacral x-ray negative for broken coccyx. Patient reports pain improved some with vicodin. Will DC home with pain medication for the weekend. Patient advised  to FU with Dr. Clearance Coots on Monday if pain still not improving. Consider outpatient ortho referral. Assessment and Plan   1. Fall at home, initial encounter   2. Sacral pain   3. [redacted] weeks gestation of pregnancy    DC home Comfort measures reviewed  3rd Trimester precautions  Bleeding precautions PTL precautions  Fetal kick counts RX: percocet 3/325 PRN #20  Return to MAU as needed FU with OB on Monday if pain is not improving. Consider outpatient ortho referral as needed  Follow-up Information    Follow up with HARPER,CHARLES A, MD.   Specialty:  Obstetrics and Gynecology   Why:  If symptoms worsen   Contact information:   [address] [place] [address] [phone]

## 2016-02-26 NOTE — Progress Notes (Signed)
Patient slipped & fell in the shower this morning. She's hurting but says baby is moving.

## 2016-02-27 ENCOUNTER — Telehealth: Payer: Self-pay

## 2016-02-27 LAB — COMPREHENSIVE METABOLIC PANEL
A/G RATIO: 1.2 (ref 1.2–2.2)
ALK PHOS: 107 IU/L (ref 39–117)
ALT: 9 IU/L (ref 0–32)
AST: 9 IU/L (ref 0–40)
Albumin: 3.5 g/dL (ref 3.5–5.5)
BUN/Creatinine Ratio: 11 (ref 9–23)
BUN: 6 mg/dL (ref 6–20)
CHLORIDE: 99 mmol/L (ref 96–106)
CO2: 20 mmol/L (ref 18–29)
Calcium: 9 mg/dL (ref 8.7–10.2)
Creatinine, Ser: 0.56 mg/dL — ABNORMAL LOW (ref 0.57–1.00)
GFR calc Af Amer: 153 mL/min/{1.73_m2} (ref >59)
GFR, EST NON AFRICAN AMERICAN: 133 mL/min/{1.73_m2} (ref >59)
GLOBULIN, TOTAL: 2.9 g/dL (ref 1.5–4.5)
Glucose: 77 mg/dL (ref 65–99)
POTASSIUM: 3.9 mmol/L (ref 3.5–5.2)
SODIUM: 137 mmol/L (ref 134–144)
Total Protein: 6.4 g/dL (ref 6.0–8.5)

## 2016-02-27 LAB — BUN: BUN: 6 mg/dL (ref 6–20)

## 2016-02-27 NOTE — Telephone Encounter (Signed)
FAXED OVER BUN/CREATINE LABS TO [facility] IN ORDER TO EXPEDITE SCHEDULING APPT WITH THEM

## 2016-03-01 ENCOUNTER — Telehealth: Payer: Self-pay | Admitting: *Deleted

## 2016-03-01 LAB — CULTURE, OB URINE

## 2016-03-01 LAB — URINE CULTURE, OB REFLEX

## 2016-03-01 NOTE — Telephone Encounter (Signed)
Patient had sexual relations last night and had an episode of pink spotting afterward. She was a little concerned and wanted to check in with us.  9:25 Call to patient-advised warning signs of preterm labor- change in discharge, contractions, bleeding, go to MAU - she understands. Probable episodic bleeding. Patient aware and will be on look out for any problem. She is aware of her next appointment.

## 2016-03-03 ENCOUNTER — Other Ambulatory Visit: Payer: Self-pay | Admitting: Certified Nurse Midwife

## 2016-03-03 DIAGNOSIS — O2343 Unspecified infection of urinary tract in pregnancy, third trimester: Secondary | ICD-10-CM

## 2016-03-03 MED ORDER — NITROFURANTOIN MONOHYD MACRO 100 MG PO CAPS: 100.0000 mg | ORAL_CAPSULE | Freq: Two times a day (BID) | ORAL | Status: AC

## 2016-03-03 MED ORDER — NITROFURANTOIN MONOHYD MACRO 100 MG PO CAPS: 100.0000 mg | ORAL_CAPSULE | Freq: Every day | ORAL | Status: AC

## 2016-03-09 ENCOUNTER — Encounter: Payer: Self-pay | Admitting: *Deleted

## 2016-03-11 ENCOUNTER — Encounter: Payer: Medicaid Other | Admitting: Certified Nurse Midwife

## 2016-03-12 ENCOUNTER — Ambulatory Visit (INDEPENDENT_AMBULATORY_CARE_PROVIDER_SITE_OTHER): Payer: Medicaid Other | Admitting: Certified Nurse Midwife

## 2016-03-12 ENCOUNTER — Encounter: Payer: Self-pay | Admitting: *Deleted

## 2016-03-12 VITALS — BP 122/78 | HR 120 | Wt 221.0 lb

## 2016-03-12 DIAGNOSIS — Z3483 Encounter for supervision of other normal pregnancy, third trimester: Secondary | ICD-10-CM

## 2016-03-12 LAB — POCT URINALYSIS DIPSTICK
Bilirubin, UA: NEGATIVE
Blood, UA: NEGATIVE
GLUCOSE UA: NEGATIVE
Nitrite, UA: NEGATIVE
PROTEIN UA: NEGATIVE
SPEC GRAV UA: 1.01
Urobilinogen, UA: NEGATIVE
pH, UA: 6

## 2016-03-12 NOTE — Progress Notes (Signed)
Patient has question about stress incontinence. And still having lots of pressure

## 2016-03-12 NOTE — Progress Notes (Signed)
Subjective:    Meghan [name] is a 25 y.o. female being seen today for her obstetrical visit. She is at 6829w1d gestation. Patient reports no complaints. Fetal movement: normal.  Needs note for school, has 12 hour clinicals for work restrictions.   Problem List Items Addressed This Visit    None     Patient Active Problem List   Diagnosis Date Noted  . Post traumatic stress disorder 03/06/2015  . Headache, migraine 06/27/2013  . NSVD (normal spontaneous vaginal delivery) 03/08/2013  . Hydronephrosis 12/05/2012  . Family planning 06/30/2012  . MDD (major depressive disorder) (HCC) 09/21/2011  . GAD (generalized anxiety disorder) 09/21/2011  . 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 GBS sent TDAP offered  Rhogam given for RH negative Pediatrician: discussed. Infant feeding: plans to breastfeed. Maternity leave: discussed, forms done. Cigarette smoking: never smoked. No orders of the defined types were placed in this encounter.   No orders of the defined types were placed in this encounter.   Follow up in 2 Weeks.

## 2016-03-18 ENCOUNTER — Telehealth: Payer: Self-pay | Admitting: *Deleted

## 2016-03-18 NOTE — Telephone Encounter (Signed)
Patient states she woke this morning with 3 different bruises that were not there when she went to bed. Should she be alarmed. 4:13 Call to patient- patient states she has 3 places 2 on her arm and 1 on her leg that just appeared over night. Pitici in appearance - she does not remember hitting herself. Per Dr Clearance Cootsharper- does not know of any condition that can develop during pregnancy that can affect clotting factors. Did suggest a CBC when patient comes to her appointment tomorrow. Patient is aware of advisement.

## 2016-03-19 ENCOUNTER — Encounter: Payer: Medicaid Other | Admitting: Certified Nurse Midwife

## 2016-03-19 ENCOUNTER — Encounter (HOSPITAL_COMMUNITY): Payer: Self-pay

## 2016-03-19 ENCOUNTER — Ambulatory Visit (HOSPITAL_COMMUNITY)
Admission: RE | Admit: 2016-03-19 | Discharge: 2016-03-19 | Disposition: A | Discharge status: Home / Self Care | Payer: Medicaid Other | Source: Ambulatory Visit | Attending: Certified Nurse Midwife | Admitting: Certified Nurse Midwife

## 2016-03-19 ENCOUNTER — Other Ambulatory Visit (HOSPITAL_COMMUNITY): Payer: Self-pay | Admitting: Maternal and Fetal Medicine

## 2016-03-19 DIAGNOSIS — Z3A33 33 weeks gestation of pregnancy: Secondary | ICD-10-CM

## 2016-03-19 DIAGNOSIS — IMO0002 Reserved for concepts with insufficient information to code with codable children: Secondary | ICD-10-CM

## 2016-03-19 DIAGNOSIS — N289 Disorder of kidney and ureter, unspecified: Secondary | ICD-10-CM

## 2016-03-19 DIAGNOSIS — O09293 Supervision of pregnancy with other poor reproductive or obstetric history, third trimester: Secondary | ICD-10-CM | POA: Diagnosis not present

## 2016-03-19 DIAGNOSIS — O99213 Obesity complicating pregnancy, third trimester: Secondary | ICD-10-CM | POA: Insufficient documentation

## 2016-03-19 NOTE — Telephone Encounter (Signed)
Did not show for appointment.

## 2016-03-22 ENCOUNTER — Other Ambulatory Visit: Payer: Self-pay | Admitting: Certified Nurse Midwife

## 2016-03-22 ENCOUNTER — Other Ambulatory Visit (HOSPITAL_COMMUNITY): Payer: Self-pay | Admitting: *Deleted

## 2016-03-22 DIAGNOSIS — O365990 Maternal care for other known or suspected poor fetal growth, unspecified trimester, not applicable or unspecified: Secondary | ICD-10-CM

## 2016-03-23 ENCOUNTER — Encounter: Payer: Self-pay | Admitting: Obstetrics

## 2016-03-23 ENCOUNTER — Other Ambulatory Visit (HOSPITAL_COMMUNITY): Payer: Self-pay | Admitting: Maternal and Fetal Medicine

## 2016-03-23 ENCOUNTER — Ambulatory Visit (INDEPENDENT_AMBULATORY_CARE_PROVIDER_SITE_OTHER): Payer: Medicaid Other | Admitting: Obstetrics

## 2016-03-23 ENCOUNTER — Encounter (HOSPITAL_COMMUNITY): Payer: Self-pay

## 2016-03-23 ENCOUNTER — Ambulatory Visit (HOSPITAL_COMMUNITY)
Admission: RE | Admit: 2016-03-23 | Discharge: 2016-03-23 | Disposition: A | Discharge status: Home / Self Care | Payer: Medicaid Other | Source: Ambulatory Visit | Attending: Certified Nurse Midwife | Admitting: Certified Nurse Midwife

## 2016-03-23 VITALS — BP 115/72 | HR 101 | Wt 220.0 lb

## 2016-03-23 DIAGNOSIS — O365930 Maternal care for other known or suspected poor fetal growth, third trimester, not applicable or unspecified: Secondary | ICD-10-CM | POA: Diagnosis not present

## 2016-03-23 DIAGNOSIS — O365990 Maternal care for other known or suspected poor fetal growth, unspecified trimester, not applicable or unspecified: Secondary | ICD-10-CM

## 2016-03-23 DIAGNOSIS — Z3A33 33 weeks gestation of pregnancy: Secondary | ICD-10-CM

## 2016-03-23 DIAGNOSIS — O99213 Obesity complicating pregnancy, third trimester: Secondary | ICD-10-CM | POA: Insufficient documentation

## 2016-03-23 DIAGNOSIS — IMO0002 Reserved for concepts with insufficient information to code with codable children: Secondary | ICD-10-CM

## 2016-03-23 DIAGNOSIS — Z3483 Encounter for supervision of other normal pregnancy, third trimester: Secondary | ICD-10-CM

## 2016-03-23 DIAGNOSIS — O09293 Supervision of pregnancy with other poor reproductive or obstetric history, third trimester: Secondary | ICD-10-CM | POA: Diagnosis not present

## 2016-03-23 DIAGNOSIS — N289 Disorder of kidney and ureter, unspecified: Secondary | ICD-10-CM

## 2016-03-23 LAB — POCT URINALYSIS DIPSTICK
Bilirubin, UA: NEGATIVE
Glucose, UA: NEGATIVE
Ketones, UA: NEGATIVE
Leukocytes, UA: NEGATIVE
NITRITE UA: NEGATIVE
PROTEIN UA: NEGATIVE
SPEC GRAV UA: 1.01
UROBILINOGEN UA: NEGATIVE
pH, UA: 7

## 2016-03-23 NOTE — Progress Notes (Signed)
Subjective:    Meghan [name] is a 25 y.o. female being seen today for her obstetrical visit. She is at 3259w5d gestation. Patient reports no complaints. Fetal movement: normal.  Problem List Items Addressed This Visit    None    Visit Diagnoses    Encounter for supervision of other normal pregnancy in third trimester    -  Primary    Relevant Orders    POCT urinalysis dipstick (Completed)    Strep Gp B NAA    NuSwab VG, Candida 6sp      Patient Active Problem List   Diagnosis Date Noted  . Post traumatic stress disorder 03/06/2015  . Headache, migraine 06/27/2013  . NSVD (normal spontaneous vaginal delivery) 03/08/2013  . Hydronephrosis 12/05/2012  . Family planning 06/30/2012  . MDD (major depressive disorder) (HCC) 09/21/2011  . GAD (generalized anxiety disorder) 09/21/2011  . 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  Growth and fetal assessment by MFM.  Plan:    Weekly BPP's at MFM with NST's here weekly after BPP's at MFM   labs reviewed, problem list updated Consent signed. GBS sent TDAP offered  Rhogam given for RH negative Pediatrician: discussed. Infant feeding: plans to breastfeed. Maternity leave: discussed. Cigarette smoking: never smoked. Orders Placed This Encounter  Procedures  . Strep Gp B NAA  . POCT urinalysis dipstick   No orders of the defined types were placed in this encounter.   Follow up in 1 Week.  Weekly NST's with BPP's at MFM.

## 2016-03-23 NOTE — Telephone Encounter (Signed)
Please call to schedule ROB appointment-she did not come

## 2016-03-26 LAB — STREP GP B NAA: Strep Gp B NAA: NEGATIVE

## 2016-03-27 ENCOUNTER — Other Ambulatory Visit: Payer: Self-pay | Admitting: Obstetrics

## 2016-03-27 DIAGNOSIS — B3731 Acute candidiasis of vulva and vagina: Secondary | ICD-10-CM

## 2016-03-27 DIAGNOSIS — B373 Candidiasis of vulva and vagina: Secondary | ICD-10-CM

## 2016-03-27 MED ORDER — FLUCONAZOLE 150 MG PO TABS: 150.0000 mg | ORAL_TABLET | Freq: Once | ORAL | Status: DC

## 2016-03-29 ENCOUNTER — Other Ambulatory Visit: Payer: Self-pay | Admitting: Obstetrics

## 2016-03-29 ENCOUNTER — Telehealth: Payer: Self-pay | Admitting: *Deleted

## 2016-03-29 DIAGNOSIS — T3695XA Adverse effect of unspecified systemic antibiotic, initial encounter: Principal | ICD-10-CM

## 2016-03-29 DIAGNOSIS — B379 Candidiasis, unspecified: Secondary | ICD-10-CM

## 2016-03-29 LAB — NUSWAB VG, CANDIDA 6SP
CANDIDA ALBICANS, NAA: POSITIVE — AB
CANDIDA GLABRATA, NAA: NEGATIVE
CANDIDA LUSITANIAE, NAA: NEGATIVE
CANDIDA TROPICALIS, NAA: NEGATIVE
Candida krusei, NAA: NEGATIVE
Candida parapsilosis, NAA: NEGATIVE
TRICH VAG BY NAA: NEGATIVE

## 2016-03-29 MED ORDER — FLUCONAZOLE 150 MG PO TABS: 150.0000 mg | ORAL_TABLET | ORAL | Status: DC

## 2016-03-29 NOTE — Telephone Encounter (Signed)
Patient states she has been on antibiotics and she has a yeast infection. She would prefer the pills for treatment. Told patient we would call in Diflucan for now and then see how she does after that with her suppressive treatment.

## 2016-03-31 ENCOUNTER — Ambulatory Visit (HOSPITAL_COMMUNITY)
Admission: RE | Admit: 2016-03-31 | Discharge: 2016-03-31 | Disposition: A | Discharge status: Home / Self Care | Payer: Medicaid Other | Source: Ambulatory Visit | Attending: Certified Nurse Midwife | Admitting: Certified Nurse Midwife

## 2016-03-31 ENCOUNTER — Ambulatory Visit (INDEPENDENT_AMBULATORY_CARE_PROVIDER_SITE_OTHER): Payer: Medicaid Other | Admitting: Certified Nurse Midwife

## 2016-03-31 ENCOUNTER — Encounter (HOSPITAL_COMMUNITY): Payer: Self-pay

## 2016-03-31 VITALS — BP 112/76 | HR 102 | Wt 223.0 lb

## 2016-03-31 DIAGNOSIS — O09293 Supervision of pregnancy with other poor reproductive or obstetric history, third trimester: Secondary | ICD-10-CM | POA: Insufficient documentation

## 2016-03-31 DIAGNOSIS — O2693 Pregnancy related conditions, unspecified, third trimester: Secondary | ICD-10-CM | POA: Insufficient documentation

## 2016-03-31 DIAGNOSIS — O26893 Other specified pregnancy related conditions, third trimester: Secondary | ICD-10-CM | POA: Diagnosis not present

## 2016-03-31 DIAGNOSIS — O99213 Obesity complicating pregnancy, third trimester: Secondary | ICD-10-CM | POA: Diagnosis not present

## 2016-03-31 DIAGNOSIS — Z3483 Encounter for supervision of other normal pregnancy, third trimester: Secondary | ICD-10-CM

## 2016-03-31 DIAGNOSIS — Z3A34 34 weeks gestation of pregnancy: Secondary | ICD-10-CM | POA: Diagnosis not present

## 2016-03-31 DIAGNOSIS — O365930 Maternal care for other known or suspected poor fetal growth, third trimester, not applicable or unspecified: Secondary | ICD-10-CM | POA: Insufficient documentation

## 2016-03-31 DIAGNOSIS — O365990 Maternal care for other known or suspected poor fetal growth, unspecified trimester, not applicable or unspecified: Secondary | ICD-10-CM

## 2016-03-31 NOTE — Progress Notes (Signed)
Subjective:    Meghan RingerLakin J Couvillon is a 25 y.o. female being seen today for her obstetrical visit. She is at 478w6d gestation. Patient reports no complaints. Fetal movement: normal.  Normal BPP today at MFM.    Problem List Items Addressed This Visit    None    Visit Diagnoses    Supervision of other normal pregnancy, antepartum, third trimester    -  Primary    Relevant Orders    AMB referral to maternal fetal medicine    Fetal non-stress test      Patient Active Problem List   Diagnosis Date Noted  . Post traumatic stress disorder 03/06/2015  . Headache, migraine 06/27/2013  . NSVD (normal spontaneous vaginal delivery) 03/08/2013  . Hydronephrosis 12/05/2012  . Family planning 06/30/2012  . MDD (major depressive disorder) (HCC) 09/21/2011  . GAD (generalized anxiety disorder) 09/21/2011  . 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 1 tracing. No contractions on toco.   Assessment:    Pregnancy @ 10378w6d weeks   Reactive NST Plan:     labs reviewed, problem list updated Consent signed. GBS planning next ROB TDAP offered  Rhogam given for RH negative Pediatrician: discussed. Infant feeding: plans to breastfeed. Maternity leave: discussed. Cigarette smoking: never smoked. Orders Placed This Encounter  Procedures  . AMB referral to maternal fetal medicine    Referral Priority:  Routine    Referral Type:  Consultation    Referral Reason:  Specialty Services Required    Number of Visits Requested:  1  . Fetal non-stress test    Standing Status: Standing     Number of Occurrences: 1     Standing Expiration Date:    No orders of the defined types were placed in this encounter.   Follow up in 1 Week with NST/GBS.

## 2016-03-31 NOTE — Progress Notes (Signed)
Patient is doing well- she just came from her MFM appointment where she had her BPP and consult with the doctor. Patient has occasional shooting pain in vagina-but nothing that is worrisome.

## 2016-04-04 ENCOUNTER — Inpatient Hospital Stay (HOSPITAL_COMMUNITY)
Admission: AD | Admit: 2016-04-04 | Discharge: 2016-04-04 | Disposition: A | Discharge status: Home / Self Care | Payer: Medicaid Other | Source: Ambulatory Visit | Attending: Obstetrics | Admitting: Obstetrics

## 2016-04-04 ENCOUNTER — Encounter (HOSPITAL_COMMUNITY): Payer: Self-pay | Admitting: *Deleted

## 2016-04-04 DIAGNOSIS — Z3A36 36 weeks gestation of pregnancy: Secondary | ICD-10-CM | POA: Insufficient documentation

## 2016-04-04 DIAGNOSIS — Z87442 Personal history of urinary calculi: Secondary | ICD-10-CM | POA: Insufficient documentation

## 2016-04-04 DIAGNOSIS — O26893 Other specified pregnancy related conditions, third trimester: Secondary | ICD-10-CM | POA: Diagnosis not present

## 2016-04-04 DIAGNOSIS — N189 Chronic kidney disease, unspecified: Secondary | ICD-10-CM | POA: Diagnosis not present

## 2016-04-04 DIAGNOSIS — O99513 Diseases of the respiratory system complicating pregnancy, third trimester: Secondary | ICD-10-CM | POA: Insufficient documentation

## 2016-04-04 DIAGNOSIS — N898 Other specified noninflammatory disorders of vagina: Secondary | ICD-10-CM | POA: Diagnosis not present

## 2016-04-04 DIAGNOSIS — J45909 Unspecified asthma, uncomplicated: Secondary | ICD-10-CM | POA: Diagnosis not present

## 2016-04-04 DIAGNOSIS — O26833 Pregnancy related renal disease, third trimester: Secondary | ICD-10-CM | POA: Diagnosis not present

## 2016-04-04 LAB — URINE MICROSCOPIC-ADD ON: BACTERIA UA: NONE SEEN

## 2016-04-04 LAB — URINALYSIS, ROUTINE W REFLEX MICROSCOPIC
BILIRUBIN URINE: NEGATIVE
Glucose, UA: NEGATIVE mg/dL
Ketones, ur: 15 mg/dL — AB
Leukocytes, UA: NEGATIVE
Nitrite: NEGATIVE
PH: 6.5 (ref 5.0–8.0)
Protein, ur: NEGATIVE mg/dL
SPECIFIC GRAVITY, URINE: 1.015 (ref 1.005–1.030)

## 2016-04-04 LAB — POCT FERN TEST: POCT Fern Test: NEGATIVE

## 2016-04-04 LAB — AMNISURE RUPTURE OF MEMBRANE (ROM) NOT AT ARMC: AMNISURE: NEGATIVE

## 2016-04-04 NOTE — MAU Provider Note (Signed)
History   [id number]   Chief Complaint  Patient presents with  . Rupture of Membranes    HPI Meghan [name] is a 25 y.o. female  G2P1001 here with report of watery vaginal discharge that began at approximately 1230 today. Leaking of fluid has not continued.  Pt denies contractions and denies vaginal bleeding.  +fetal movement.   All other systems negative.   Was checked in office last week & dilated 2 cm.   Patient's last menstrual period was 09/14/2015.  OB History  Gravida Para Term Preterm AB SAB TAB Ectopic Multiple Living  0 0 0 0 0 0 1    # Outcome Date GA Lbr Len/2nd Weight Sex Delivery Anes PTL Lv  2 Current           1 Term 03/08/13 [redacted]w[redacted]d 10:11 / 00:21 5 lb 15.1 oz (2.695 kg) F Vag-Spont EPI  Y      Past Medical History  Diagnosis Date  . Anxiety   . Depression   . Headache(784.0)   . Obesity (BMI 30-39.9)     Pt  doesn't  "eat much"  does not eat veggies/fruit  . ADHD (attention deficit hyperactivity disorder)   . Chronic kidney disease     kidney stones  . Asthma   . Post traumatic stress disorder 03/06/2015  . PTSD (post-traumatic stress disorder)     Family History  Problem Relation Age of Onset  . Bipolar disorder Mother   . Anxiety disorder Mother   . Hypertension Mother   . Bipolar disorder Maternal Grandmother   . Hypertension Maternal Grandmother   . Depression Cousin   . Bipolar disorder Other   . Hypertension Maternal Grandfather     Social History   Social History  . Marital Status: Single    Spouse Name: N/A  . Number of Children: N/A  . Years of Education: N/A   Social History Main Topics  . Smoking status: Never Smoker   . Smokeless tobacco: Never Used  . Alcohol Use: No  . Drug Use: No  . Sexual Activity:    Partners: Male    Birth Control/ Protection: None   Other Topics Concern  . None   Social History Narrative    No Known Allergies  No current facility-administered medications on file prior to encounter.    Current Outpatient Prescriptions on File Prior to Encounter  Medication Sig Dispense Refill  . albuterol (PROVENTIL HFA;VENTOLIN HFA) 108 (90 BASE) MCG/ACT inhaler Inhale 2 puffs into the lungs every 4 (four) hours as needed for wheezing or shortness of breath. Reported on 02/13/2016    . busPIRone (BUSPAR) 7.5 MG tablet Take 1 tablet (7.5 mg total) by mouth 2 (two) times daily. 60 tablet 6  . calcium carbonate (TUMS - DOSED IN MG ELEMENTAL CALCIUM) 500 MG chewable tablet Chew 2 tablets by mouth as needed. For heartburn    . diphenhydrAMINE (BENADRYL) 25 MG tablet Take 2 tablets (50 mg total) by mouth at bedtime. 90 tablet 4  . fluconazole (DIFLUCAN) 150 MG tablet Take 1 tablet (150 mg total) by mouth once. (Patient not taking: Reported on 03/31/2016) 1 tablet 2  . fluconazole (DIFLUCAN) 150 MG tablet Take 1 tablet (150 mg total) by mouth every other day. (Patient not taking: Reported on 03/31/2016) 2 tablet 0  . FLUoxetine (PROZAC) 40 MG capsule Take 1 capsule (40 mg total) by mouth daily. 30 capsule 6  . hydrOXYzine (ATARAX/VISTARIL) 25 MG tablet Take 1  tablet (25 mg total) by mouth at bedtime as needed for anxiety. 30 tablet 1  . loratadine-pseudoephedrine (CLARITIN-D 24 HOUR) 10-240 MG 24 hr tablet Take 1 tablet by mouth daily. 30 tablet 12  . omeprazole (PRILOSEC) 20 MG capsule Take 1 capsule (20 mg total) by mouth 2 (two) times daily before a meal. 60 capsule 6  . oxyCODONE-acetaminophen (PERCOCET/ROXICET) 5-325 MG tablet Take 1-2 tablets by mouth every 6 (six) hours as needed for severe pain. 20 tablet 0  . Prenatal Vit-Fe Fumarate-FA (PRENATAL MULTIVITAMIN) TABS Take 1 tablet by mouth at bedtime.       Review of Systems  Constitutional: Negative.   Gastrointestinal: Positive for abdominal pain (lower abdominal pressure).  Genitourinary: Positive for vaginal discharge. Negative for vaginal bleeding.     Physical Exam   Filed Vitals:   04/04/16 1512  BP: 137/75  Pulse: 90  Temp:  97.8 F (36.6 C)  TempSrc: Oral  Resp: 18    Physical Exam  Nursing note and vitals reviewed. Constitutional: She is oriented to person, place, and time. She appears well-developed and well-nourished. No distress.  HENT:  Head: Normocephalic and atraumatic.  Eyes: Conjunctivae are normal. Right eye exhibits no discharge. Left eye exhibits no discharge. No scleral icterus.  Neck: Normal range of motion.  Cardiovascular: Normal rate, regular rhythm and normal heart sounds.   No murmur heard. Respiratory: Effort normal and breath sounds normal. No respiratory distress. She has no wheezes.  GI: Soft. There is no tenderness.  Genitourinary: Cervix exhibits discharge (small amount of yellow mucoid discharge).  No pooling  Neurological: She is alert and oriented to person, place, and time.  Skin: Skin is warm and dry. She is not diaphoretic.  Psychiatric: She has a normal mood and affect. Her behavior is normal. Judgment and thought content normal.   Dilation: 1.5 Effacement (%): Thick Cervical Position: Posterior Exam by:: Judeth HornErin Eular Panek NP  Fetal Tracing:  Baseline: 150 Variability: moderate Accelerations: 15x15 Decelerations: none  Toco: UI   MAU Course  Procedures Results for orders placed or performed during the hospital encounter of 04/04/16 (from the past 24 hour(s))  Urinalysis, Routine w reflex microscopic (not [facility])     Status: Abnormal   Collection Time: 04/04/16  3:00 PM  Result Value Ref Range   Color, Urine YELLOW YELLOW   APPearance CLEAR CLEAR   Specific Gravity, Urine 1.015 1.005 - 1.030   pH 6.5 5.0 - 8.0   Glucose, UA NEGATIVE NEGATIVE mg/dL   Hgb urine dipstick SMALL (A) NEGATIVE   Bilirubin Urine NEGATIVE NEGATIVE   Ketones, ur 15 (A) NEGATIVE mg/dL   Protein, ur NEGATIVE NEGATIVE mg/dL   Nitrite NEGATIVE NEGATIVE   Leukocytes, UA NEGATIVE NEGATIVE  Urine microscopic-add on     Status: Abnormal   Collection Time: 04/04/16  3:00 PM  Result Value  Ref Range   Squamous Epithelial / LPF 0-5 (A) NONE SEEN   WBC, UA 0-5 0 - 5 WBC/hpf   RBC / HPF 6-30 0 - 5 RBC/hpf   Bacteria, UA NONE SEEN NONE SEEN  Amnisure rupture of membrane (rom)not at [facility]     Status: None   Collection Time: 04/04/16  3:45 PM  Result Value Ref Range   Amnisure ROM NEGATIVE   Fern Test     Status: Normal   Collection Time: 04/04/16  3:45 PM  Result Value Ref Range   POCT Fern Test Negative = intact amniotic membranes     MDM Reactive  tracing No pooling, fern & amnisure negative  Assessment and Plan  A: 1. Vaginal discharge during pregnancy in third trimester     P: Discharge home Preterm labor precautions & fetal kick count form Keep f/u with ob   Judeth Horn, NP 04/04/2016 3:21 PM

## 2016-04-04 NOTE — MAU Note (Signed)
Pt reports she felt a gush of fluid and increased pelvic pressure around 12:30 today. Has had some leaking since.

## 2016-04-04 NOTE — Discharge Instructions (Signed)
Preterm Labor Information Preterm labor is when labor starts before you are [redacted] weeks pregnant. The normal length of pregnancy is 39 to 41 weeks.  CAUSES  The cause of preterm labor is not often known. The most common known cause is infection. RISK FACTORS  Having a history of preterm labor.  Having your water break before it should.  Having a placenta that covers the opening of the cervix.  Having a placenta that breaks away from the uterus.  Having a cervix that is too weak to hold the baby in the uterus.  Having too much fluid in the amniotic sac.  Taking drugs or smoking while pregnant.  Not gaining enough weight while pregnant.  Being younger than 17 and older than 23 years old.  Having a low income.  Being African American. SYMPTOMS 1. Period-like cramps, belly (abdominal) pain, or back pain. 2. Contractions that are regular, as often as six in an hour. They may be mild or painful. 3. Contractions that start at the top of the belly. They then move to the lower belly and back. 4. Lower belly pressure that seems to get stronger. 5. Bleeding from the vagina. 6. Fluid leaking from the vagina. TREATMENT  Treatment depends on:  Your condition.  The condition of your baby.  How many weeks pregnant you are. Your doctor may have you:  Take medicine to stop contractions.  Stay in bed except to use the restroom (bed rest).  Stay in the hospital. WHAT SHOULD YOU DO IF YOU THINK YOU ARE IN PRETERM LABOR? Call your doctor right away. You need to go to the hospital right away.  HOW CAN YOU PREVENT PRETERM LABOR IN FUTURE PREGNANCIES?  Stop smoking, if you smoke.  Maintain healthy weight gain.  Do not take drugs or be around chemicals that are not needed.  Tell your doctor if you think you have an infection.  Tell your doctor if you had a preterm labor before.   This information is not intended to replace advice given to you by your health care provider. Make sure  you discuss any questions you have with your health care provider.   Document Released: 01/21/2009 Document Revised: 03/11/2015 Document Reviewed: 11/27/2012 Elsevier Interactive Patient Education 2016 [employer] Inc[employer]    Fetal Movement Counts Patient Name: __________________________________________________ Patient Due Date: ____________________ Performing a fetal movement count is highly recommended in high-risk pregnancies, but it is good for every pregnant woman to do. Your health care provider may ask you to start counting fetal movements at 28 weeks of the pregnancy. Fetal movements often increase:  After eating a full meal.  After physical activity.  After eating or drinking something sweet or cold.  At rest. Pay attention to when you feel the baby is most active. This will help you notice a pattern of your baby's sleep and wake cycles and what factors contribute to an increase in fetal movement. It is important to perform a fetal movement count at the same time each day when your baby is normally most active.  HOW TO COUNT FETAL MOVEMENTS 7. Find a quiet and comfortable area to sit or lie down on your left side. Lying on your left side provides the best blood and oxygen circulation to your baby. 8. Write down the day and time on a sheet of paper or in a journal. 9. Start counting kicks, flutters, swishes, rolls, or jabs in a 2-hour period. You should feel at least 10 movements within 2 hours. 10. If  you do not feel 10 movements in 2 hours, wait 2-3 hours and count again. Look for a change in the pattern or not enough counts in 2 hours. SEEK MEDICAL CARE IF:  You feel less than 10 counts in 2 hours, tried twice.  There is no movement in over an hour.  The pattern is changing or taking longer each day to reach 10 counts in 2 hours.  You feel the baby is not moving as he or she usually does. Date: ____________ Movements: ____________ Start time: ____________ Meghan MartinFinish time:  ____________  Date: ____________ Movements: ____________ Start time: ____________ Meghan MartinFinish time: ____________ Date: ____________ Movements: ____________ Start time: ____________ Meghan MartinFinish time: ____________ Date: ____________ Movements: ____________ Start time: ____________ Meghan MartinFinish time: ____________ Date: ____________ Movements: ____________ Start time: ____________ Meghan MartinFinish time: ____________ Date: ____________ Movements: ____________ Start time: ____________ Meghan MartinFinish time: ____________ Date: ____________ Movements: ____________ Start time: ____________ Meghan MartinFinish time: ____________ Date: ____________ Movements: ____________ Start time: ____________ Meghan MartinFinish time: ____________  Date: ____________ Movements: ____________ Start time: ____________ Meghan MartinFinish time: ____________ Date: ____________ Movements: ____________ Start time: ____________ Meghan MartinFinish time: ____________ Date: ____________ Movements: ____________ Start time: ____________ Meghan MartinFinish time: ____________ Date: ____________ Movements: ____________ Start time: ____________ Meghan MartinFinish time: ____________ Date: ____________ Movements: ____________ Start time: ____________ Meghan MartinFinish time: ____________ Date: ____________ Movements: ____________ Start time: ____________ Meghan MartinFinish time: ____________ Date: ____________ Movements: ____________ Start time: ____________ Meghan MartinFinish time: ____________  Date: ____________ Movements: ____________ Start time: ____________ Meghan MartinFinish time: ____________ Date: ____________ Movements: ____________ Start time: ____________ Meghan MartinFinish time: ____________ Date: ____________ Movements: ____________ Start time: ____________ Meghan MartinFinish time: ____________ Date: ____________ Movements: ____________ Start time: ____________ Meghan MartinFinish time: ____________ Date: ____________ Movements: ____________ Start time: ____________ Meghan MartinFinish time: ____________ Date: ____________ Movements: ____________ Start time: ____________ Meghan MartinFinish time: ____________ Date: ____________ Movements:  ____________ Start time: ____________ Meghan MartinFinish time: ____________  Date: ____________ Movements: ____________ Start time: ____________ Meghan MartinFinish time: ____________ Date: ____________ Movements: ____________ Start time: ____________ Meghan MartinFinish time: ____________ Date: ____________ Movements: ____________ Start time: ____________ Meghan MartinFinish time: ____________ Date: ____________ Movements: ____________ Start time: ____________ Meghan MartinFinish time: ____________ Date: ____________ Movements: ____________ Start time: ____________ Meghan MartinFinish time: ____________ Date: ____________ Movements: ____________ Start time: ____________ Meghan MartinFinish time: ____________ Date: ____________ Movements: ____________ Start time: ____________ Meghan MartinFinish time: ____________  Date: ____________ Movements: ____________ Start time: ____________ Meghan MartinFinish time: ____________ Date: ____________ Movements: ____________ Start time: ____________ Meghan MartinFinish time: ____________ Date: ____________ Movements: ____________ Start time: ____________ Meghan MartinFinish time: ____________ Date: ____________ Movements: ____________ Start time: ____________ Meghan MartinFinish time: ____________ Date: ____________ Movements: ____________ Start time: ____________ Meghan MartinFinish time: ____________ Date: ____________ Movements: ____________ Start time: ____________ Meghan MartinFinish time: ____________ Date: ____________ Movements: ____________ Start time: ____________ Meghan MartinFinish time: ____________  Date: ____________ Movements: ____________ Start time: ____________ Meghan MartinFinish time: ____________ Date: ____________ Movements: ____________ Start time: ____________ Meghan MartinFinish time: ____________ Date: ____________ Movements: ____________ Start time: ____________ Meghan MartinFinish time: ____________ Date: ____________ Movements: ____________ Start time: ____________ Meghan MartinFinish time: ____________ Date: ____________ Movements: ____________ Start time: ____________ Meghan MartinFinish time: ____________ Date: ____________ Movements: ____________ Start time: ____________ Meghan MartinFinish  time: ____________ Date: ____________ Movements: ____________ Start time: ____________ Meghan MartinFinish time: ____________  Date: ____________ Movements: ____________ Start time: ____________ Meghan MartinFinish time: ____________ Date: ____________ Movements: ____________ Start time: ____________ Meghan MartinFinish time: ____________ Date: ____________ Movements: ____________ Start time: ____________ Meghan MartinFinish time: ____________ Date: ____________ Movements: ____________ Start time: ____________ Meghan MartinFinish time: ____________ Date: ____________ Movements: ____________ Start time: ____________ Meghan MartinFinish time: ____________ Date: ____________ Movements: ____________ Start time: ____________ Meghan MartinFinish time: ____________ Date: ____________ Movements: ____________ Start time: ____________ Meghan MartinFinish time: ____________  Date: ____________ Movements: ____________  Start time: ____________ Meghan MartinFinish time: ____________ Date: ____________ Movements: ____________ Start time: ____________ Meghan MartinFinish time: ____________ Date: ____________ Movements: ____________ Start time: ____________ Meghan MartinFinish time: ____________ Date: ____________ Movements: ____________ Start time: ____________ Meghan MartinFinish time: ____________ Date: ____________ Movements: ____________ Start time: ____________ Meghan MartinFinish time: ____________ Date: ____________ Movements: ____________ Start time: ____________ Meghan MartinFinish time: ____________   This information is not intended to replace advice given to you by your health care provider. Make sure you discuss any questions you have with your health care provider.   Document Released: 11/24/2006 Document Revised: 11/15/2014 Document Reviewed: 08/21/2012 Elsevier Interactive Patient Education [employer]2016 Else[employer] Inc[employer]

## 2016-04-07 ENCOUNTER — Ambulatory Visit (HOSPITAL_COMMUNITY)
Admission: RE | Admit: 2016-04-07 | Discharge: 2016-04-07 | Disposition: A | Discharge status: Home / Self Care | Payer: Medicaid Other | Source: Ambulatory Visit | Attending: Certified Nurse Midwife | Admitting: Certified Nurse Midwife

## 2016-04-07 ENCOUNTER — Encounter: Payer: Medicaid Other | Admitting: Certified Nurse Midwife

## 2016-04-07 ENCOUNTER — Encounter (HOSPITAL_COMMUNITY): Payer: Self-pay

## 2016-04-07 DIAGNOSIS — O365930 Maternal care for other known or suspected poor fetal growth, third trimester, not applicable or unspecified: Secondary | ICD-10-CM | POA: Insufficient documentation

## 2016-04-07 DIAGNOSIS — Z3A35 35 weeks gestation of pregnancy: Secondary | ICD-10-CM | POA: Diagnosis not present

## 2016-04-07 DIAGNOSIS — O365990 Maternal care for other known or suspected poor fetal growth, unspecified trimester, not applicable or unspecified: Secondary | ICD-10-CM

## 2016-04-08 ENCOUNTER — Encounter: Payer: Medicaid Other | Admitting: Obstetrics

## 2016-04-14 ENCOUNTER — Ambulatory Visit (HOSPITAL_COMMUNITY): Admission: RE | Admit: 2016-04-14 | Payer: Medicaid Other | Source: Ambulatory Visit

## 2016-04-14 ENCOUNTER — Ambulatory Visit (INDEPENDENT_AMBULATORY_CARE_PROVIDER_SITE_OTHER): Payer: Medicaid Other | Admitting: Certified Nurse Midwife

## 2016-04-14 VITALS — BP 113/69 | HR 90 | Wt 225.0 lb

## 2016-04-14 DIAGNOSIS — Z3483 Encounter for supervision of other normal pregnancy, third trimester: Secondary | ICD-10-CM

## 2016-04-14 DIAGNOSIS — O9989 Other specified diseases and conditions complicating pregnancy, childbirth and the puerperium: Secondary | ICD-10-CM

## 2016-04-14 LAB — POCT URINALYSIS DIPSTICK
Bilirubin, UA: NEGATIVE
Glucose, UA: NEGATIVE
Leukocytes, UA: NEGATIVE
NITRITE UA: NEGATIVE
PROTEIN UA: NEGATIVE
SPEC GRAV UA: 1.015
UROBILINOGEN UA: NEGATIVE
pH, UA: 7

## 2016-04-15 ENCOUNTER — Telehealth (HOSPITAL_COMMUNITY): Payer: Self-pay | Admitting: *Deleted

## 2016-04-15 ENCOUNTER — Encounter (HOSPITAL_COMMUNITY): Payer: Self-pay | Admitting: *Deleted

## 2016-04-15 NOTE — Telephone Encounter (Signed)
Preadmission screen  

## 2016-04-16 ENCOUNTER — Encounter (HOSPITAL_COMMUNITY): Payer: Self-pay

## 2016-04-16 ENCOUNTER — Other Ambulatory Visit (HOSPITAL_COMMUNITY): Payer: Self-pay | Admitting: Maternal and Fetal Medicine

## 2016-04-16 ENCOUNTER — Ambulatory Visit (HOSPITAL_COMMUNITY)
Admission: RE | Admit: 2016-04-16 | Discharge: 2016-04-16 | Disposition: A | Discharge status: Home / Self Care | Payer: Medicaid Other | Source: Ambulatory Visit | Attending: Certified Nurse Midwife | Admitting: Certified Nurse Midwife

## 2016-04-16 ENCOUNTER — Other Ambulatory Visit: Payer: Self-pay | Admitting: Certified Nurse Midwife

## 2016-04-16 DIAGNOSIS — O365930 Maternal care for other known or suspected poor fetal growth, third trimester, not applicable or unspecified: Secondary | ICD-10-CM | POA: Diagnosis not present

## 2016-04-16 DIAGNOSIS — O368191 Decreased fetal movements, unspecified trimester, fetus 1: Secondary | ICD-10-CM

## 2016-04-16 DIAGNOSIS — Z3A37 37 weeks gestation of pregnancy: Secondary | ICD-10-CM

## 2016-04-16 DIAGNOSIS — O09293 Supervision of pregnancy with other poor reproductive or obstetric history, third trimester: Secondary | ICD-10-CM

## 2016-04-16 DIAGNOSIS — O99213 Obesity complicating pregnancy, third trimester: Secondary | ICD-10-CM

## 2016-04-16 LAB — URINE CULTURE, OB REFLEX

## 2016-04-16 LAB — CULTURE, OB URINE

## 2016-04-17 NOTE — Progress Notes (Addendum)
Subjective:    Meghan [name] is a 25 y.o. female being seen today for her obstetrical visit. She is at 2571w5d gestation. Patient reports no complaints. Fetal movement: normal.  Problem List Items Addressed This Visit    None    Visit Diagnoses    Encounter for supervision of other normal pregnancy in third trimester    -  Primary    Relevant Orders    POCT urinalysis dipstick (Completed)    Culture, OB Urine (Completed)      Patient Active Problem List   Diagnosis Date Noted  . Post traumatic stress disorder 03/06/2015  . Headache, migraine 06/27/2013  . NSVD (normal spontaneous vaginal delivery) 03/08/2013  . Hydronephrosis 12/05/2012  . Family planning 06/30/2012  . MDD (major depressive disorder) (HCC) 09/21/2011  . GAD (generalized anxiety disorder) 09/21/2011  . 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 1 tracing. No contractions on toco.   Assessment:    Pregnancy @ 7846w2d weeks   Reactive NST Plan:    IOL scheduled.   Plans for delivery: Vaginal anticipated; labs reviewed; problem list updated Counseling: Consent signed. Infant feeding: plans to breastfeed. Cigarette smoking: never smoked. L&D discussion: symptoms of labor, discussed when to call, discussed what number to call, anesthetic/analgesic options reviewed and delivering clinician:  plans no preference. Postpartum supports and preparation: circumcision discussed and contraception plans discussed.  Follow up in 1 Week.

## 2016-04-18 ENCOUNTER — Encounter (HOSPITAL_COMMUNITY): Payer: Self-pay | Admitting: Certified Nurse Midwife

## 2016-04-18 ENCOUNTER — Inpatient Hospital Stay (HOSPITAL_COMMUNITY)
Admission: AD | Admit: 2016-04-18 | Discharge: 2016-04-18 | Disposition: A | Discharge status: Home / Self Care | Payer: Medicaid Other | Source: Ambulatory Visit | Attending: Obstetrics | Admitting: Obstetrics

## 2016-04-18 DIAGNOSIS — O212 Late vomiting of pregnancy: Secondary | ICD-10-CM | POA: Diagnosis present

## 2016-04-18 DIAGNOSIS — J45909 Unspecified asthma, uncomplicated: Secondary | ICD-10-CM | POA: Insufficient documentation

## 2016-04-18 DIAGNOSIS — O99613 Diseases of the digestive system complicating pregnancy, third trimester: Secondary | ICD-10-CM | POA: Insufficient documentation

## 2016-04-18 DIAGNOSIS — N189 Chronic kidney disease, unspecified: Secondary | ICD-10-CM | POA: Diagnosis not present

## 2016-04-18 DIAGNOSIS — K219 Gastro-esophageal reflux disease without esophagitis: Secondary | ICD-10-CM | POA: Diagnosis not present

## 2016-04-18 DIAGNOSIS — O26833 Pregnancy related renal disease, third trimester: Secondary | ICD-10-CM | POA: Insufficient documentation

## 2016-04-18 DIAGNOSIS — Z3A37 37 weeks gestation of pregnancy: Secondary | ICD-10-CM | POA: Insufficient documentation

## 2016-04-18 DIAGNOSIS — O99513 Diseases of the respiratory system complicating pregnancy, third trimester: Secondary | ICD-10-CM | POA: Insufficient documentation

## 2016-04-18 DIAGNOSIS — O219 Vomiting of pregnancy, unspecified: Secondary | ICD-10-CM | POA: Diagnosis not present

## 2016-04-18 DIAGNOSIS — Z87442 Personal history of urinary calculi: Secondary | ICD-10-CM | POA: Diagnosis not present

## 2016-04-18 LAB — CBC
HCT: 30.2 % — ABNORMAL LOW (ref 36.0–46.0)
HEMOGLOBIN: 9.8 g/dL — AB (ref 12.0–15.0)
MCH: 27.1 pg (ref 26.0–34.0)
MCHC: 32.5 g/dL (ref 30.0–36.0)
MCV: 83.7 fL (ref 78.0–100.0)
PLATELETS: 146 10*3/uL — AB (ref 150–400)
RBC: 3.61 MIL/uL — AB (ref 3.87–5.11)
RDW: 14 % (ref 11.5–15.5)
WBC: 10.8 10*3/uL — AB (ref 4.0–10.5)

## 2016-04-18 LAB — URINE MICROSCOPIC-ADD ON

## 2016-04-18 LAB — URINALYSIS, ROUTINE W REFLEX MICROSCOPIC
Bilirubin Urine: NEGATIVE
GLUCOSE, UA: NEGATIVE mg/dL
LEUKOCYTES UA: NEGATIVE
Nitrite: NEGATIVE
PH: 6 (ref 5.0–8.0)
Protein, ur: NEGATIVE mg/dL
Specific Gravity, Urine: 1.025 (ref 1.005–1.030)

## 2016-04-18 LAB — COMPREHENSIVE METABOLIC PANEL
ALBUMIN: 2.3 g/dL — AB (ref 3.5–5.0)
ALK PHOS: 129 U/L — AB (ref 38–126)
ALT: 10 U/L — AB (ref 14–54)
AST: 13 U/L — AB (ref 15–41)
Anion gap: 7 (ref 5–15)
BUN: 10 mg/dL (ref 6–20)
CALCIUM: 7.8 mg/dL — AB (ref 8.9–10.3)
CHLORIDE: 105 mmol/L (ref 101–111)
CO2: 21 mmol/L — AB (ref 22–32)
CREATININE: 0.7 mg/dL (ref 0.44–1.00)
GFR calc non Af Amer: >60 mL/min (ref >60)
GLUCOSE: 336 mg/dL — AB (ref 65–99)
Potassium: 3.5 mmol/L (ref 3.5–5.1)
SODIUM: 133 mmol/L — AB (ref 135–145)
Total Bilirubin: 0.5 mg/dL (ref 0.3–1.2)
Total Protein: 5.4 g/dL — ABNORMAL LOW (ref 6.5–8.1)

## 2016-04-18 MED ORDER — ONDANSETRON 4 MG PO TBDP: 4.0000 mg | ORAL_TABLET | Freq: Three times a day (TID) | ORAL | Status: DC | PRN

## 2016-04-18 MED ORDER — DEXTROSE 5 % IN LACTATED RINGERS IV BOLUS: 1000.0000 mL | Freq: Once | INTRAVENOUS | Status: DC

## 2016-04-18 MED ORDER — ONDANSETRON HCL 4 MG/2ML IJ SOLN
4.0000 mg | Freq: Once | INTRAMUSCULAR | Status: AC
  Administered 2016-04-18: 4 mg via INTRAVENOUS
  Filled 2016-04-18: qty 2

## 2016-04-18 MED ORDER — PROMETHAZINE HCL 25 MG/ML IJ SOLN
25.0000 mg | Freq: Once | INTRAVENOUS | Status: AC
  Administered 2016-04-18: 25 mg via INTRAVENOUS
  Filled 2016-04-18: qty 1

## 2016-04-18 MED ORDER — LACTATED RINGERS IV BOLUS (SEPSIS)
1000.0000 mL | Freq: Once | INTRAVENOUS | Status: AC
  Administered 2016-04-18: 1000 mL via INTRAVENOUS

## 2016-04-18 NOTE — MAU Provider Note (Signed)
History     CSN: [id number]  Arrival date and time: 04/18/16 1045   First Provider Initiated Contact with Patient 04/18/16 1141      Chief Complaint  Patient presents with  . Emesis   HPI   [name] is a 25 y.o. female G2P1001 @ 23w3dhere with N/V that started last night around 0330. She vomited continuously since 0330 and started vomiting stomach bile early this morning. Patient has a history of GERD; she ate taco's for dinner last night and felt fine when she went to bed.  No sick contacts   Denies abdominal pain, vaginal bleeding or leaking of fluid + mild HA  Patient currently taking Macrobid for UTI; she denies dysuria.   OB History    Gravida Para Term Preterm AB TAB SAB Ectopic Multiple Living   '2 1 1 ' 0 0 0 0 0 0 1      Past Medical History  Diagnosis Date  . Anxiety   . Depression   . Headache(784.0)   . Obesity (BMI 30-39.9)     Pt  doesn't  "eat much"  does not eat veggies/fruit  . ADHD (attention deficit hyperactivity disorder)   . Chronic kidney disease     kidney stones  . Asthma   . Post traumatic stress disorder 03/06/2015  . PTSD (post-traumatic stress disorder)     Past Surgical History  Procedure Laterality Date  . Wisdom tooth extraction    . Cholecystectomy  2015  . Tonsillectomy  2016    Family History  Problem Relation Age of Onset  . Bipolar disorder Mother   . Anxiety disorder Mother   . Hypertension Mother   . Bipolar disorder Maternal Grandmother   . Hypertension Maternal Grandmother   . Depression Cousin   . Bipolar disorder Other   . Hypertension Maternal Grandfather     Social History  Substance Use Topics  . Smoking status: Never Smoker   . Smokeless tobacco: Never Used  . Alcohol Use: No    Allergies: No Known Allergies  Prescriptions prior to admission  Medication Sig Dispense Refill Last Dose  . acetaminophen (TYLENOL) 325 MG tablet Take 650 mg by mouth every 6 (six) hours as needed for mild pain.     04/18/2016 at Unknown time  . albuterol (PROVENTIL HFA;VENTOLIN HFA) 108 (90 BASE) MCG/ACT inhaler Inhale 2 puffs into the lungs every 4 (four) hours as needed for wheezing or shortness of breath. Reported on 02/13/2016   Past Month at Unknown time  . busPIRone (BUSPAR) 7.5 MG tablet Take 1 tablet (7.5 mg total) by mouth 2 (two) times daily. 60 tablet 6 04/17/2016 at Unknown time  . calcium carbonate (TUMS - DOSED IN MG ELEMENTAL CALCIUM) 500 MG chewable tablet Chew 2 tablets by mouth as needed. For heartburn   04/17/2016 at Unknown time  . FLUoxetine (PROZAC) 40 MG capsule Take 1 capsule (40 mg total) by mouth daily. 30 capsule 6 04/17/2016 at Unknown time  . Nitrofurantoin Monohyd Macro (MACROBID PO) Take 1 capsule by mouth daily.    04/17/2016 at Unknown time  . omeprazole (PRILOSEC) 20 MG capsule Take 1 capsule (20 mg total) by mouth 2 (two) times daily before a meal. 60 capsule 6 04/17/2016 at Unknown time  . Prenatal Vit-Fe Fumarate-FA (PRENATAL MULTIVITAMIN) TABS Take 1 tablet by mouth at bedtime.    04/17/2016 at Unknown time   Results for orders placed or performed during the hospital encounter of 04/18/16 (from the past  48 hour(s))  Urinalysis, Routine w reflex microscopic (not at [facility])     Status: Abnormal   Collection Time: 04/18/16 10:50 AM  Result Value Ref Range   Color, Urine YELLOW YELLOW   APPearance CLEAR CLEAR   Specific Gravity, Urine 1.025 1.005 - 1.030   pH 6.0 5.0 - 8.0   Glucose, UA NEGATIVE NEGATIVE mg/dL   Hgb urine dipstick SMALL (A) NEGATIVE   Bilirubin Urine NEGATIVE NEGATIVE   Ketones, ur >80 (A) NEGATIVE mg/dL   Protein, ur NEGATIVE NEGATIVE mg/dL   Nitrite NEGATIVE NEGATIVE   Leukocytes, UA NEGATIVE NEGATIVE  Urine microscopic-add on     Status: Abnormal   Collection Time: 04/18/16 10:50 AM  Result Value Ref Range   Squamous Epithelial / LPF 0-5 (A) NONE SEEN   WBC, UA 0-5 0 - 5 WBC/hpf   RBC / HPF 0-5 0 - 5 RBC/hpf   Bacteria, UA FEW (A) NONE SEEN  CBC      Status: Abnormal   Collection Time: 04/18/16 12:40 PM  Result Value Ref Range   WBC 10.8 (H) 4.0 - 10.5 K/uL   RBC 3.61 (L) 3.87 - 5.11 MIL/uL   Hemoglobin 9.8 (L) 12.0 - 15.0 g/dL   HCT 30.2 (L) 36.0 - 46.0 %   MCV 83.7 78.0 - 100.0 fL   MCH 27.1 26.0 - 34.0 pg   MCHC 32.5 30.0 - 36.0 g/dL   RDW 14.0 11.5 - 15.5 %   Platelets 146 (L) 150 - 400 K/uL  Comprehensive metabolic panel     Status: Abnormal   Collection Time: 04/18/16 12:40 PM  Result Value Ref Range   Sodium 133 (L) 135 - 145 mmol/L   Potassium 3.5 3.5 - 5.1 mmol/L   Chloride 105 101 - 111 mmol/L   CO2 21 (L) 22 - 32 mmol/L   Glucose, Bld 336 (H) 65 - 99 mg/dL   BUN 10 6 - 20 mg/dL   Creatinine, Ser 0.70 0.44 - 1.00 mg/dL   Calcium 7.8 (L) 8.9 - 10.3 mg/dL   Total Protein 5.4 (L) 6.5 - 8.1 g/dL   Albumin 2.3 (L) 3.5 - 5.0 g/dL   AST 13 (L) 15 - 41 U/L   ALT 10 (L) 14 - 54 U/L   Alkaline Phosphatase 129 (H) 38 - 126 U/L   Total Bilirubin 0.5 0.3 - 1.2 mg/dL   GFR calc non Af Amer >60 >60 mL/min   GFR calc Af Amer >60 >60 mL/min    Comment: (NOTE) The eGFR has been calculated using the CKD EPI equation. This calculation has not been validated in all clinical situations. eGFR's persistently <60 mL/min signify possible Chronic Kidney Disease.    Anion gap 7 5 - 15    Review of Systems  Constitutional: Negative for fever.  Eyes: Negative for blurred vision and double vision.  Respiratory: Negative for shortness of breath.   Cardiovascular: Negative for chest pain.  Gastrointestinal: Positive for nausea and vomiting.  Genitourinary: Negative for dysuria, urgency and flank pain.  Musculoskeletal: Positive for back pain (Right lower back pain ).  Neurological: Positive for dizziness and headaches.   Physical Exam   Blood pressure 98/52, pulse 89, temperature 97.3 F (36.3 C), temperature source Oral, resp. rate 18, last menstrual period 09/14/2015, SpO2 99 %, unknown if currently breastfeeding.  Physical Exam   Constitutional: She is oriented to person, place, and time. She appears well-developed and well-nourished.  Non-toxic appearance. She has a sickly appearance.  HENT:  Head: Normocephalic.  GI: Soft. She exhibits no distension and no mass. There is no tenderness. There is no rebound, no guarding and no CVA tenderness.  Genitourinary:  Cervix: 1cm, thick, posterior   Musculoskeletal: Normal range of motion. She exhibits no edema or tenderness.       Lumbar back: She exhibits pain. She exhibits normal range of motion, no bony tenderness, no swelling, no edema, no deformity, no laceration, no spasm and normal pulse.       Arms: Neurological: She is alert and oriented to person, place, and time.  Skin: There is pallor.   Fetal Tracing: Baseline: 135 bpm  Variability: Moderate  Accelerations: 15x15 Decelerations: quick variable  Toco: None- quiet   MAU Course  Procedures  None  MDM  Urine shows >80 ketones  D5 with 25 mg phenergan bolus X 1 LR bolus X 1 Patient up to the restroom without difficulty  Zofran 4 mg IV Patient feeling better, tolerating PO fluids.     Assessment and Plan   A:  1. Nausea and vomiting in pregnancy   2. Gastroesophageal reflux disease, esophagitis presence not specified     P:  Discharge home in stable condition Rx: Zofran BRAT diet Return to MAU if symptoms worsen Increase PO fluids Labor precautions Fetal kick counts.    Lezlie Lye, NP 04/18/2016 1:56 PM

## 2016-04-18 NOTE — MAU Note (Signed)
Pt states she started vomiting at 4AM and it lasted until 730AM. Pt states she has a kidney infection and is on Macrobid for the past month. Pt states she has occasional ctxs and denies vaginal bleeding or LOF. Fetus active.

## 2016-04-18 NOTE — Discharge Instructions (Signed)

## 2016-04-19 ENCOUNTER — Other Ambulatory Visit: Payer: Self-pay | Admitting: *Deleted

## 2016-04-21 ENCOUNTER — Encounter (HOSPITAL_COMMUNITY): Payer: Self-pay

## 2016-04-21 ENCOUNTER — Ambulatory Visit (INDEPENDENT_AMBULATORY_CARE_PROVIDER_SITE_OTHER): Payer: Medicaid Other | Admitting: Obstetrics

## 2016-04-21 ENCOUNTER — Other Ambulatory Visit (HOSPITAL_COMMUNITY): Payer: Self-pay | Admitting: Maternal and Fetal Medicine

## 2016-04-21 ENCOUNTER — Ambulatory Visit (HOSPITAL_COMMUNITY)
Admission: RE | Admit: 2016-04-21 | Discharge: 2016-04-21 | Disposition: A | Discharge status: Home / Self Care | Payer: Medicaid Other | Source: Ambulatory Visit | Attending: Certified Nurse Midwife | Admitting: Certified Nurse Midwife

## 2016-04-21 VITALS — BP 119/82 | HR 84 | Wt 225.0 lb

## 2016-04-21 DIAGNOSIS — O09293 Supervision of pregnancy with other poor reproductive or obstetric history, third trimester: Secondary | ICD-10-CM

## 2016-04-21 DIAGNOSIS — Z3A37 37 weeks gestation of pregnancy: Secondary | ICD-10-CM | POA: Insufficient documentation

## 2016-04-21 DIAGNOSIS — O99213 Obesity complicating pregnancy, third trimester: Secondary | ICD-10-CM | POA: Diagnosis not present

## 2016-04-21 DIAGNOSIS — Z3483 Encounter for supervision of other normal pregnancy, third trimester: Secondary | ICD-10-CM

## 2016-04-21 DIAGNOSIS — O365930 Maternal care for other known or suspected poor fetal growth, third trimester, not applicable or unspecified: Secondary | ICD-10-CM | POA: Insufficient documentation

## 2016-04-21 LAB — POCT URINALYSIS DIPSTICK
BILIRUBIN UA: NEGATIVE
GLUCOSE UA: NEGATIVE
Ketones, UA: NEGATIVE
LEUKOCYTES UA: NEGATIVE
NITRITE UA: NEGATIVE
PH UA: 7
Protein, UA: NEGATIVE
Spec Grav, UA: 1.01
Urobilinogen, UA: NEGATIVE

## 2016-04-21 NOTE — Progress Notes (Signed)
[facility], [facility]&V.

## 2016-04-22 ENCOUNTER — Encounter: Payer: Self-pay | Admitting: Obstetrics

## 2016-04-22 NOTE — Addendum Note (Signed)
Addended by: Marya LandryFOSTER, Johnathen Testa D on: 04/22/2016 01:08 PM   Modules accepted: Orders

## 2016-04-22 NOTE — Progress Notes (Signed)
Subjective:    Meghan [name] is a 25 y.o. female being seen today for her obstetrical visit. She is at 3630w0d gestation. Patient reports no complaints. Fetal movement: normal.  Problem List Items Addressed This Visit    None     Patient Active Problem List   Diagnosis Date Noted  . Post traumatic stress disorder 03/06/2015  . Headache, migraine 06/27/2013  . NSVD (normal spontaneous vaginal delivery) 03/08/2013  . Hydronephrosis 12/05/2012  . Family planning 06/30/2012  . MDD (major depressive disorder) (HCC) 09/21/2011  . GAD (generalized anxiety disorder) 09/21/2011  . 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 Infant feeding: plans to breastfeed. Cigarette smoking: never smoked. L&D discussion: symptoms of labor, discussed when to call, discussed what number to call, anesthetic/analgesic options reviewed and delivering clinician:  plans no preference. Postpartum supports and preparation: circumcision discussed and contraception plans discussed.  Follow up in 1 Week.

## 2016-04-22 NOTE — Addendum Note (Signed)
Addended by: Marya LandryFOSTER, Ashana Tullo D on: 04/22/2016 05:06 PM   Modules accepted: Orders

## 2016-04-23 ENCOUNTER — Ambulatory Visit (HOSPITAL_COMMUNITY): Payer: Medicaid Other

## 2016-04-24 LAB — URINE CULTURE, OB REFLEX

## 2016-04-24 LAB — CULTURE, OB URINE

## 2016-04-27 ENCOUNTER — Encounter: Payer: Medicaid Other | Admitting: Certified Nurse Midwife

## 2016-04-27 ENCOUNTER — Inpatient Hospital Stay (HOSPITAL_COMMUNITY): Admission: RE | Admit: 2016-04-27 | Payer: Medicaid Other | Source: Ambulatory Visit

## 2016-04-29 ENCOUNTER — Inpatient Hospital Stay (HOSPITAL_COMMUNITY)
Admission: RE | Admit: 2016-04-29 | Discharge: 2016-05-02 | DRG: 775 | Disposition: A | Discharge status: Home / Self Care | Payer: Medicaid Other | Source: Ambulatory Visit | Attending: Obstetrics | Admitting: Obstetrics

## 2016-04-29 ENCOUNTER — Encounter (HOSPITAL_COMMUNITY): Payer: Self-pay

## 2016-04-29 ENCOUNTER — Inpatient Hospital Stay (HOSPITAL_COMMUNITY): Payer: Medicaid Other | Admitting: Anesthesiology

## 2016-04-29 DIAGNOSIS — N189 Chronic kidney disease, unspecified: Secondary | ICD-10-CM | POA: Diagnosis present

## 2016-04-29 DIAGNOSIS — IMO0002 Reserved for concepts with insufficient information to code with codable children: Secondary | ICD-10-CM | POA: Diagnosis present

## 2016-04-29 DIAGNOSIS — Z3A39 39 weeks gestation of pregnancy: Secondary | ICD-10-CM | POA: Diagnosis not present

## 2016-04-29 DIAGNOSIS — K219 Gastro-esophageal reflux disease without esophagitis: Secondary | ICD-10-CM | POA: Diagnosis present

## 2016-04-29 DIAGNOSIS — Z6841 Body Mass Index (BMI) 40.0 and over, adult: Secondary | ICD-10-CM | POA: Diagnosis not present

## 2016-04-29 DIAGNOSIS — O99214 Obesity complicating childbirth: Secondary | ICD-10-CM | POA: Diagnosis present

## 2016-04-29 DIAGNOSIS — J45909 Unspecified asthma, uncomplicated: Secondary | ICD-10-CM | POA: Diagnosis present

## 2016-04-29 DIAGNOSIS — O9962 Diseases of the digestive system complicating childbirth: Secondary | ICD-10-CM | POA: Diagnosis present

## 2016-04-29 DIAGNOSIS — F418 Other specified anxiety disorders: Secondary | ICD-10-CM | POA: Diagnosis present

## 2016-04-29 DIAGNOSIS — G4733 Obstructive sleep apnea (adult) (pediatric): Secondary | ICD-10-CM | POA: Diagnosis present

## 2016-04-29 DIAGNOSIS — O365930 Maternal care for other known or suspected poor fetal growth, third trimester, not applicable or unspecified: Principal | ICD-10-CM | POA: Diagnosis present

## 2016-04-29 DIAGNOSIS — O99344 Other mental disorders complicating childbirth: Secondary | ICD-10-CM | POA: Diagnosis present

## 2016-04-29 DIAGNOSIS — O9952 Diseases of the respiratory system complicating childbirth: Secondary | ICD-10-CM | POA: Diagnosis present

## 2016-04-29 DIAGNOSIS — Z818 Family history of other mental and behavioral disorders: Secondary | ICD-10-CM | POA: Diagnosis not present

## 2016-04-29 DIAGNOSIS — O26833 Pregnancy related renal disease, third trimester: Secondary | ICD-10-CM | POA: Diagnosis present

## 2016-04-29 LAB — CBC
HEMATOCRIT: 31.2 % — AB (ref 36.0–46.0)
Hemoglobin: 10.3 g/dL — ABNORMAL LOW (ref 12.0–15.0)
MCH: 27.2 pg (ref 26.0–34.0)
MCHC: 33 g/dL (ref 30.0–36.0)
MCV: 82.3 fL (ref 78.0–100.0)
PLATELETS: 179 10*3/uL (ref 150–400)
RBC: 3.79 MIL/uL — AB (ref 3.87–5.11)
RDW: 14.2 % (ref 11.5–15.5)
WBC: 8 10*3/uL (ref 4.0–10.5)

## 2016-04-29 LAB — RPR: RPR Ser Ql: NONREACTIVE

## 2016-04-29 MED ORDER — OXYCODONE-ACETAMINOPHEN 5-325 MG PO TABS: 1.0000 | ORAL_TABLET | ORAL | Status: DC | PRN

## 2016-04-29 MED ORDER — OXYCODONE-ACETAMINOPHEN 5-325 MG PO TABS: 2.0000 | ORAL_TABLET | ORAL | Status: DC | PRN

## 2016-04-29 MED ORDER — LACTATED RINGERS IV SOLN: 500.0000 mL | INTRAVENOUS | Status: DC | PRN

## 2016-04-29 MED ORDER — LIDOCAINE HCL (PF) 1 % IJ SOLN
INTRAMUSCULAR | Status: DC | PRN
  Administered 2016-04-29: 5 mL via EPIDURAL
  Administered 2016-04-29: 2 mL via EPIDURAL
  Administered 2016-04-29: 3 mL via EPIDURAL

## 2016-04-29 MED ORDER — LACTATED RINGERS IV SOLN
INTRAVENOUS | Status: DC
  Administered 2016-04-29 – 2016-04-30 (×2): via INTRAVENOUS

## 2016-04-29 MED ORDER — FENTANYL 2.5 MCG/ML BUPIVACAINE 1/10 % EPIDURAL INFUSION (WH - ANES)
14.0000 mL/h | INTRAMUSCULAR | Status: DC | PRN
  Administered 2016-04-29 – 2016-04-30 (×3): 14 mL/h via EPIDURAL
  Filled 2016-04-29 (×3): qty 125

## 2016-04-29 MED ORDER — PHENYLEPHRINE 40 MCG/ML (10ML) SYRINGE FOR IV PUSH (FOR BLOOD PRESSURE SUPPORT)
80.0000 ug | PREFILLED_SYRINGE | INTRAVENOUS | Status: DC | PRN
  Filled 2016-04-29: qty 5
  Filled 2016-04-29: qty 10

## 2016-04-29 MED ORDER — PHENYLEPHRINE 40 MCG/ML (10ML) SYRINGE FOR IV PUSH (FOR BLOOD PRESSURE SUPPORT)
80.0000 ug | PREFILLED_SYRINGE | INTRAVENOUS | Status: DC | PRN
  Filled 2016-04-29: qty 5

## 2016-04-29 MED ORDER — HYDROXYZINE HCL 50 MG PO TABS
50.0000 mg | ORAL_TABLET | Freq: Four times a day (QID) | ORAL | Status: DC | PRN
  Filled 2016-04-29: qty 1

## 2016-04-29 MED ORDER — SOD CITRATE-CITRIC ACID 500-334 MG/5ML PO SOLN
30.0000 mL | ORAL | Status: DC | PRN
  Administered 2016-04-30 (×2): 30 mL via ORAL
  Filled 2016-04-29 (×2): qty 15

## 2016-04-29 MED ORDER — EPHEDRINE 5 MG/ML INJ
10.0000 mg | INTRAVENOUS | Status: DC | PRN
  Filled 2016-04-29: qty 2

## 2016-04-29 MED ORDER — ACETAMINOPHEN 325 MG PO TABS: 650.0000 mg | ORAL_TABLET | ORAL | Status: DC | PRN

## 2016-04-29 MED ORDER — DIPHENHYDRAMINE HCL 50 MG/ML IJ SOLN: 12.5000 mg | INTRAMUSCULAR | Status: DC | PRN

## 2016-04-29 MED ORDER — MISOPROSTOL 25 MCG QUARTER TABLET
25.0000 ug | ORAL_TABLET | ORAL | Status: DC | PRN
  Administered 2016-04-29 (×3): 25 ug via VAGINAL
  Filled 2016-04-29: qty 1
  Filled 2016-04-29 (×2): qty 0.25

## 2016-04-29 MED ORDER — ONDANSETRON HCL 4 MG/2ML IJ SOLN: 4.0000 mg | Freq: Four times a day (QID) | INTRAMUSCULAR | Status: DC | PRN

## 2016-04-29 MED ORDER — LACTATED RINGERS IV SOLN
500.0000 mL | Freq: Once | INTRAVENOUS | Status: AC
Start: 2016-04-29 — End: 2016-04-29
  Administered 2016-04-29: 500 mL via INTRAVENOUS

## 2016-04-29 MED ORDER — LIDOCAINE HCL (PF) 1 % IJ SOLN
30.0000 mL | INTRAMUSCULAR | Status: DC | PRN
  Filled 2016-04-29: qty 30

## 2016-04-29 MED ORDER — BUTORPHANOL TARTRATE 1 MG/ML IJ SOLN: 1.0000 mg | INTRAMUSCULAR | Status: DC | PRN

## 2016-04-29 MED ORDER — LACTATED RINGERS IV SOLN: 500.0000 mL | Freq: Once | INTRAVENOUS | Status: DC

## 2016-04-29 MED ORDER — FENTANYL CITRATE (PF) 100 MCG/2ML IJ SOLN
100.0000 ug | INTRAMUSCULAR | Status: DC | PRN
Start: 2016-04-29 — End: 2016-04-30
  Administered 2016-04-29: 100 ug via INTRAVENOUS

## 2016-04-29 MED ORDER — OXYTOCIN 40 UNITS IN LACTATED RINGERS INFUSION - SIMPLE MED
2.5000 [IU]/h | INTRAVENOUS | Status: DC
  Filled 2016-04-29: qty 1000

## 2016-04-29 MED ORDER — OXYTOCIN BOLUS FROM INFUSION: 500.0000 mL | INTRAVENOUS | Status: DC

## 2016-04-29 MED ORDER — TERBUTALINE SULFATE 1 MG/ML IJ SOLN
0.2500 mg | Freq: Once | INTRAMUSCULAR | Status: DC | PRN
  Filled 2016-04-29: qty 1

## 2016-04-29 MED ORDER — FENTANYL CITRATE (PF) 100 MCG/2ML IJ SOLN
INTRAMUSCULAR | Status: AC
  Filled 2016-04-29: qty 2

## 2016-04-29 NOTE — Anesthesia Pain Management Evaluation Note (Signed)
  CRNA Pain Management Visit Note  Patient: Meghan [name], 25 y.o., female  "Hello I am a member of the anesthesia team at [facility]. We have an anesthesia team available at all times to provide care throughout the hospital, including epidural management and anesthesia for C-section. I don't know your plan for the delivery whether it a natural birth, water birth, IV sedation, nitrous supplementation, doula or epidural, but we want to meet your pain goals."   1.Was your pain managed to your expectations on prior hospitalizations?   No prior hospitalizations  2.What is your expectation for pain management during this hospitalization?     IV pain meds  3.How can we help you reach that goal? Natural birth with IV medication suplementation  Record the patient's initial score and the patient's pain goal.   Pain: 0  Pain Goal: 10 [facility] wants you to be able to say your pain was always managed very well.  [name],Meghan [name] 04/29/2016

## 2016-04-29 NOTE — Progress Notes (Signed)
Meghan [name] is a 25 y.o. G2P1001 at 4081w0d by LMP admitted for induction of labor due to Elective at term.  Subjective:   Objective: BP 111/68 mmHg  Pulse 63  Temp(Src) 97.3 F (36.3 C) (Oral)  Resp 18  Ht 5' (1.524 m)  Wt 221 lb (100.245 kg)  BMI 43.16 kg/m2  SpO2 100%  LMP 09/14/2015      FHT:  FHR: 130 bpm, variability: moderate,  accelerations:  Present,  decelerations:  Absent UC:   regular, every 3-4 minutes SVE:   Dilation: 5 Effacement (%): 90 Station: 0 Exam by:: Barrie DunkerMurayyah Johnson RN  Labs: Lab Results  Component Value Date   WBC 8.0 04/29/2016   HGB 10.3* 04/29/2016   HCT 31.2* 04/29/2016   MCV 82.3 04/29/2016   PLT 179 04/29/2016    Assessment / Plan: 24yo G2P1 at 39 weeks with H/O chronic renal disease.  IOL per recommendation of MFM at 39 weeks.  Progressing well.  Monitor closely..    Labor: Progressing normally Preeclampsia:  n/a Fetal Wellbeing:  n/a Pain Control:  Epidural I/D:  n/a Anticipated MOD:  NSVD  HARPER,CHARLES A 04/29/2016, 9:18 PM

## 2016-04-29 NOTE — Anesthesia Procedure Notes (Signed)
Epidural Patient location during procedure: OB  Staffing Anesthesiologist: TURK, STEPHEN EDWARD Performed by: anesthesiologist   Preanesthetic Checklist Completed: patient identified, pre-op evaluation, timeout performed, IV checked, risks and benefits discussed and monitors and equipment checked  Epidural Patient position: sitting Prep: DuraPrep Patient monitoring: blood pressure and continuous pulse ox Approach: midline Location: L3-L4 Injection technique: LOR air  Needle:  Needle type: Tuohy  Needle gauge: 17 G Needle length: 9 cm Needle insertion depth: 7 cm Catheter size: 19 Gauge Catheter at skin depth: 12 cm Test dose: negative and Other (1% Lidocaine)  Additional Notes Patient identified.  Risk benefits discussed including failed block, incomplete pain control, headache, nerve damage, paralysis, blood pressure changes, nausea, vomiting, reactions to medication both toxic or allergic, and postpartum back pain.  Patient expressed understanding and wished to proceed.  All questions were answered.  Sterile technique used throughout procedure and epidural site dressed with sterile barrier dressing. No paresthesia or other complications noted. The patient did not experience any signs of intravascular injection such as tinnitus or metallic taste in mouth nor signs of intrathecal spread such as rapid motor block. Please see nursing notes for vital signs. Reason for block:procedure for pain   

## 2016-04-29 NOTE — H&P (Signed)
Meghan [name] is a 25 y.o. female presenting for IOL at term per MFM recommendations Dr. Claudean SeveranceWhitecar 04/16/16 delivery by 39 weeks. History OB History    Gravida Para Term Preterm AB TAB SAB Ectopic Multiple Living   2 1 1  0 0 0 0 0 0 1     Past Medical History  Diagnosis Date  . Anxiety   . Depression   . Headache(784.0)   . Obesity (BMI 30-39.9)     Pt  doesn't  "eat much"  does not eat veggies/fruit  . ADHD (attention deficit hyperactivity disorder)   . Chronic kidney disease     kidney stones  . Asthma   . Post traumatic stress disorder 03/06/2015  . PTSD (post-traumatic stress disorder)    Past Surgical History  Procedure Laterality Date  . Wisdom tooth extraction    . Cholecystectomy  2015  . Tonsillectomy  2016   Family History: family history includes Anxiety disorder in her mother; Bipolar disorder in her maternal grandmother, mother, and other; Depression in her cousin; Hypertension in her maternal grandfather, maternal grandmother, and mother. Social History:  reports that she has never smoked. She has never used smokeless tobacco. She reports that she does not drink alcohol or use illicit drugs.   Prenatal Transfer Tool  Maternal Diabetes: No Genetic Screening: Normal Maternal Ultrasounds/Referrals: Normal Fetal Ultrasounds or other Referrals:  Referred to Materal Fetal Medicine  Maternal Substance Abuse:  No Significant Maternal Medications:  Meds include: Other: Macrobid for chronic UTI's in pregnancy and hx of kidney issues.  Has been to nephrology this pregnancy. Has been on Prilosec for GERD.  Significant Maternal Lab Results:  None Other Comments:  None  ROS  Dilation: Fingertip Effacement (%): 40 Station: -3 Exam by:: Wandra MannanWanda Osborne RN Blood pressure 128/71, pulse 72, temperature 98.9 F (37.2 C), temperature source Oral, resp. rate 20, height 5' (1.524 m), weight 221 lb (100.245 kg), last menstrual period 09/14/2015, unknown if currently  breastfeeding. Exam Physical Exam  Prenatal labs: ABO, Rh: A/POS/-- (12/08 1004) Antibody: NEG (12/08 1004) Rubella: 1.22 (12/08 1004) RPR: Non Reactive (04/07 1100)  HBsAg: NEGATIVE (12/08 1004)  HIV: Non Reactive (04/07 1100)  GBS: Negative (05/17 0846)   Assessment/Plan: Admit for IOL per MFM recommendations.  Anticipate NSVD.    Roe Coombsachelle A Denney, CNM 04/29/2016, 8:40 AM

## 2016-04-29 NOTE — Anesthesia Preprocedure Evaluation (Signed)
Anesthesia Evaluation  Patient identified by MRN, date of birth, ID band Patient awake    Reviewed: Allergy & Precautions, NPO status , Patient's Chart, lab work & pertinent test results  Airway Mallampati: II  TM Distance: >3 FB Neck ROM: Full    Dental  (+) Teeth Intact, Dental Advisory Given   Pulmonary asthma ,    Pulmonary exam normal breath sounds clear to auscultation       Cardiovascular negative cardio ROS Normal cardiovascular exam Rhythm:Regular Rate:Normal     Neuro/Psych  Headaches, PSYCHIATRIC DISORDERS Anxiety Depression    GI/Hepatic negative GI ROS, Neg liver ROS,   Endo/Other  Morbid obesity  Renal/GU Renal disease (hydronephrosis)     Musculoskeletal negative musculoskeletal ROS (+)   Abdominal   Peds  Hematology  (+) Blood dyscrasia, anemia , Plt 179k   Anesthesia Other Findings Day of surgery medications reviewed with the patient.  Reproductive/Obstetrics (+) Pregnancy                             Anesthesia Physical Anesthesia Plan  ASA: III  Anesthesia Plan: Epidural   Post-op Pain Management:    Induction:   Airway Management Planned:   Additional Equipment:   Intra-op Plan:   Post-operative Plan:   Informed Consent: I have reviewed the patients History and Physical, chart, labs and discussed the procedure including the risks, benefits and alternatives for the proposed anesthesia with the patient or authorized representative who has indicated his/her understanding and acceptance.   Dental advisory given  Plan Discussed with:   Anesthesia Plan Comments: (Patient identified. Risks/Benefits/Options discussed with patient including but not limited to bleeding, infection, nerve damage, paralysis, failed block, incomplete pain control, headache, blood pressure changes, nausea, vomiting, reactions to medication both or allergic, itching and postpartum back  pain. Confirmed with bedside nurse the patient's most recent platelet count. Confirmed with patient that they are not currently taking any anticoagulation, have any bleeding history or any family history of bleeding disorders. Patient expressed understanding and wished to proceed. All questions were answered.   After being called to patient room for epidural placement request, informed by bedside nurse that OBGYN requesting deferment of epidural placement.  OBGYN intends to treat labor pain with IV medications at this time.)        Anesthesia Quick Evaluation

## 2016-04-29 NOTE — Anesthesia Pain Management Evaluation Note (Signed)
  CRNA Pain Management Visit Note  Patient: Meghan [name], 25 y.o., female  "Hello I am a [facility] of the anesthesia team [facility]. We have an anesthesia team available at all times to provide care throughout the hospital, including epidural management and anesthesia for C-section. I don't know your plan for the delivery whether it a natural birth, water birth, IV sedation, nitrous supplementation, doula or epidural, but we want to meet your pain goals."   1.Was your pain managed to your expectations on prior hospitalizations?   No prior hospitalizations  2.What is your expectation for pain management during this hospitalization?     IV pain meds  3.How can we help you reach that goal? Natural with IV medication  Record the patient's initial score and the patient's pain goal.   Pain: 1  Pain Goal: 10 [facility] wants you to be able to say your pain was always managed very well.  [facility] SuitesEIGHT,Meghan [name] 04/29/2016

## 2016-04-30 ENCOUNTER — Encounter (HOSPITAL_COMMUNITY): Payer: Self-pay

## 2016-04-30 MED ORDER — ONDANSETRON HCL 4 MG PO TABS: 4.0000 mg | ORAL_TABLET | ORAL | Status: DC | PRN

## 2016-04-30 MED ORDER — ZOLPIDEM TARTRATE 5 MG PO TABS
5.0000 mg | ORAL_TABLET | Freq: Every evening | ORAL | Status: DC | PRN
Start: 2016-04-30 — End: 2016-05-02

## 2016-04-30 MED ORDER — PRENATAL MULTIVITAMIN CH
1.0000 | ORAL_TABLET | Freq: Every day | ORAL | Status: DC
  Administered 2016-05-01 – 2016-05-02 (×2): 1 via ORAL
  Filled 2016-04-30 (×2): qty 1

## 2016-04-30 MED ORDER — ONDANSETRON HCL 4 MG/2ML IJ SOLN: 4.0000 mg | INTRAMUSCULAR | Status: DC | PRN

## 2016-04-30 MED ORDER — MEASLES, MUMPS & RUBELLA VAC ~~LOC~~ INJ
0.5000 mL | INJECTION | Freq: Once | SUBCUTANEOUS | Status: DC
  Filled 2016-04-30: qty 0.5

## 2016-04-30 MED ORDER — SENNOSIDES-DOCUSATE SODIUM 8.6-50 MG PO TABS
2.0000 | ORAL_TABLET | ORAL | Status: DC
  Administered 2016-05-01 – 2016-05-02 (×2): 2 via ORAL
  Filled 2016-04-30 (×2): qty 2

## 2016-04-30 MED ORDER — OXYCODONE-ACETAMINOPHEN 5-325 MG PO TABS
1.0000 | ORAL_TABLET | ORAL | Status: DC | PRN
  Administered 2016-05-01 (×2): 1 via ORAL
  Filled 2016-04-30 (×2): qty 1

## 2016-04-30 MED ORDER — NITROFURANTOIN MONOHYD MACRO 100 MG PO CAPS
100.0000 mg | ORAL_CAPSULE | Freq: Two times a day (BID) | ORAL | Status: DC
Start: 2016-04-30 — End: 2016-05-02
  Administered 2016-05-01 – 2016-05-02 (×4): 100 mg via ORAL
  Filled 2016-04-30 (×4): qty 1

## 2016-04-30 MED ORDER — BISACODYL 10 MG RE SUPP: 10.0000 mg | Freq: Every day | RECTAL | Status: DC | PRN

## 2016-04-30 MED ORDER — WITCH HAZEL-GLYCERIN EX PADS: 1.0000 "application " | MEDICATED_PAD | CUTANEOUS | Status: DC | PRN

## 2016-04-30 MED ORDER — ALBUTEROL SULFATE (2.5 MG/3ML) 0.083% IN NEBU: 3.0000 mL | INHALATION_SOLUTION | RESPIRATORY_TRACT | Status: DC | PRN

## 2016-04-30 MED ORDER — COCONUT OIL OIL: 1.0000 "application " | TOPICAL_OIL | Status: DC | PRN

## 2016-04-30 MED ORDER — FLEET ENEMA 7-19 GM/118ML RE ENEM: 1.0000 | ENEMA | Freq: Every day | RECTAL | Status: DC | PRN

## 2016-04-30 MED ORDER — METHYLERGONOVINE MALEATE 0.2 MG PO TABS: 0.2000 mg | ORAL_TABLET | ORAL | Status: DC | PRN

## 2016-04-30 MED ORDER — MEDROXYPROGESTERONE ACETATE 150 MG/ML IM SUSP: 150.0000 mg | INTRAMUSCULAR | Status: DC | PRN

## 2016-04-30 MED ORDER — ACETAMINOPHEN 325 MG PO TABS
650.0000 mg | ORAL_TABLET | ORAL | Status: DC | PRN
  Administered 2016-04-30: 650 mg via ORAL
  Filled 2016-04-30: qty 2

## 2016-04-30 MED ORDER — PANTOPRAZOLE SODIUM 40 MG PO TBEC
40.0000 mg | DELAYED_RELEASE_TABLET | Freq: Every day | ORAL | Status: DC
  Administered 2016-05-01 – 2016-05-02 (×2): 40 mg via ORAL
  Filled 2016-04-30 (×2): qty 1

## 2016-04-30 MED ORDER — BUSPIRONE HCL 15 MG PO TABS
7.5000 mg | ORAL_TABLET | Freq: Two times a day (BID) | ORAL | Status: DC
  Administered 2016-05-01 – 2016-05-02 (×4): 7.5 mg via ORAL
  Filled 2016-04-30 (×4): qty 1

## 2016-04-30 MED ORDER — FLUOXETINE HCL 20 MG PO CAPS
40.0000 mg | ORAL_CAPSULE | Freq: Every day | ORAL | Status: DC
  Administered 2016-04-30 – 2016-05-02 (×3): 40 mg via ORAL
  Filled 2016-04-30 (×3): qty 2

## 2016-04-30 MED ORDER — BENZOCAINE-MENTHOL 20-0.5 % EX AERO: 1.0000 "application " | INHALATION_SPRAY | CUTANEOUS | Status: DC | PRN

## 2016-04-30 MED ORDER — OXYTOCIN 40 UNITS IN LACTATED RINGERS INFUSION - SIMPLE MED: 2.5000 [IU]/h | INTRAVENOUS | Status: DC | PRN

## 2016-04-30 MED ORDER — METHYLERGONOVINE MALEATE 0.2 MG/ML IJ SOLN: 0.2000 mg | INTRAMUSCULAR | Status: DC | PRN

## 2016-04-30 MED ORDER — FERROUS SULFATE 325 (65 FE) MG PO TABS
325.0000 mg | ORAL_TABLET | Freq: Two times a day (BID) | ORAL | Status: DC
  Administered 2016-04-30 – 2016-05-02 (×4): 325 mg via ORAL
  Filled 2016-04-30 (×4): qty 1

## 2016-04-30 MED ORDER — DIBUCAINE 1 % RE OINT: 1.0000 "application " | TOPICAL_OINTMENT | RECTAL | Status: DC | PRN

## 2016-04-30 MED ORDER — OXYCODONE-ACETAMINOPHEN 5-325 MG PO TABS: 2.0000 | ORAL_TABLET | ORAL | Status: DC | PRN

## 2016-04-30 MED ORDER — DIPHENHYDRAMINE HCL 25 MG PO CAPS: 25.0000 mg | ORAL_CAPSULE | Freq: Four times a day (QID) | ORAL | Status: DC | PRN

## 2016-04-30 MED ORDER — SIMETHICONE 80 MG PO CHEW: 80.0000 mg | CHEWABLE_TABLET | ORAL | Status: DC | PRN

## 2016-04-30 MED ORDER — TETANUS-DIPHTH-ACELL PERTUSSIS 5-2.5-18.5 LF-MCG/0.5 IM SUSP: 0.5000 mL | Freq: Once | INTRAMUSCULAR | Status: DC

## 2016-04-30 MED ORDER — OXYTOCIN 40 UNITS IN LACTATED RINGERS INFUSION - SIMPLE MED
1.0000 m[IU]/min | INTRAVENOUS | Status: DC
  Administered 2016-04-30: 2 m[IU]/min via INTRAVENOUS

## 2016-04-30 MED ORDER — IBUPROFEN 600 MG PO TABS
600.0000 mg | ORAL_TABLET | Freq: Four times a day (QID) | ORAL | Status: DC
  Administered 2016-04-30 – 2016-05-02 (×8): 600 mg via ORAL
  Filled 2016-04-30 (×8): qty 1

## 2016-04-30 NOTE — Progress Notes (Signed)
Meghan [name] is [name] 25 y.o. G2P1001 at 2324w1d by LMP admitted for induction of labor due to Poor fetal growth, Elective at term and chronic renal disease.  Subjective:   Objective: BP 104/60 mmHg  Pulse 91  Temp(Src) 98.6 F (37 C) (Oral)  Resp 18  Ht 5' (1.524 m)  Wt 221 lb (100.245 kg)  BMI 43.16 kg/m2  SpO2 100%  LMP 09/14/2015      FHT:  FHR: 140 bpm, variability: moderate,  accelerations:  Present,  decelerations:  Absent UC:   regular, every 3-4 minutes SVE:   Dilation: 6 Effacement (%): 90 Station: -1 Exam by:: Meghan DunkerMurayyah Johnson RN  Labs: Lab Results  Component Value Date   WBC 8.0 04/29/2016   HGB 10.3* 04/29/2016   HCT 31.2* 04/29/2016   MCV 82.3 04/29/2016   PLT 179 04/29/2016    Assessment / Plan: Induction of labor due to IUGR and materal medical conditions,  progressing well on pitocin  Labor: Progressing normally Preeclampsia:  n/[name] Fetal Wellbeing:  good Pain Control:  Epidural I/D:  n/[name] Anticipated MOD:  NSVD  Meghan [name] 04/30/2016, 6:07 AM

## 2016-04-30 NOTE — Anesthesia Postprocedure Evaluation (Signed)
Anesthesia Post Note  Patient: Meghan [name]  Procedure(s) Performed: * No procedures listed *  Patient location during evaluation: Mother Baby Anesthesia Type: Epidural Level of consciousness: awake and alert and oriented Pain management: satisfactory to patient Vital Signs Assessment: post-procedure vital signs reviewed and stable Respiratory status: spontaneous breathing and nonlabored ventilation Cardiovascular status: stable Postop Assessment: no headache, no backache, no signs of nausea or vomiting, adequate PO intake and patient able to bend at knees (patient up walking) Anesthetic complications: no     Last Vitals:  Filed Vitals:   04/30/16 1705 04/30/16 2044  BP: 104/51 98/52  Pulse: 61 69  Temp: 36.4 C 36.5 C  Resp: 18 16    Last Pain:  Filed Vitals:   04/30/16 2044  PainSc: 5    Pain Goal:                 Madison HickmanGREGORY,Perry Brucato

## 2016-04-30 NOTE — Lactation Note (Signed)
This note was copied from a baby's chart. Lactation Consultation Note  Patient Name: Meghan [name] ZOXWR'UToday's Date: 04/30/2016 Reason for consult: Initial assessment Baby at 4 hr of life and mom reports she is latching well. Mom used NS with her older child and was asking for them again. Explained that as long as baby is latching well there is no need for them. Mom stated she bf her older child for 6wk until her MD told her she did not have enough fat in her milk and needed to stop bf. She stated that she pumped for 24 hr and the milk was sent to a lab. Discussed baby behavior, feeding frequency, baby belly size, voids, wt loss, breast changes, and nipple care. She stated she can manually express and has a spoon in the room. Given lactation handouts. Aware of OP services and support group.    Maternal Data Has patient been taught Hand Expression?: Yes Does the patient have breastfeeding experience prior to this delivery?: Yes  Feeding Feeding Type: Breast Fed Length of feed: 30 min  LATCH Score/Interventions                      Lactation Tools Discussed/Used WIC Program: Yes   Consult Status Consult Status: Follow-up Date: 05/01/16 Follow-up type: In-patient    [name] 04/30/2016, 6:09 PM

## 2016-05-01 LAB — CBC
HEMATOCRIT: 25.6 % — AB (ref 36.0–46.0)
HEMOGLOBIN: 8.6 g/dL — AB (ref 12.0–15.0)
MCH: 27.3 pg (ref 26.0–34.0)
MCHC: 33.6 g/dL (ref 30.0–36.0)
MCV: 81.3 fL (ref 78.0–100.0)
Platelets: 142 10*3/uL — ABNORMAL LOW (ref 150–400)
RBC: 3.15 MIL/uL — ABNORMAL LOW (ref 3.87–5.11)
RDW: 14.3 % (ref 11.5–15.5)
WBC: 11.6 10*3/uL — AB (ref 4.0–10.5)

## 2016-05-01 NOTE — Lactation Note (Signed)
This note was copied from a baby's chart. Lactation Consultation Note; Mom reports baby has been nursing well. No LS charted yet. Encouraged to call for assist with latch at next feeding. Reports baby fed about 1 1/2 hours ago and she is asleep in bassinet at this time. Reviewed normal newborn behavior the first 24 hours and cluster feeding the second night. No questions at present. To call for assist prn  Patient Name: Meghan [name] ZOXWR'UToday's Date: 05/01/2016 Reason for consult: Follow-up assessment;Infant < 6lbs   Maternal Data Formula Feeding for Exclusion: No Does the patient have breastfeeding experience prior to this delivery?: Yes  Feeding Feeding Type: Breast Fed Length of feed: 20 min  LATCH Score/Interventions                      Lactation Tools Discussed/Used     Consult Status Consult Status: Follow-up Date: 05/02/16 Follow-up type: In-patient    Pamelia HoitWeeks, Jarrett Chicoine D 05/01/2016, 11:35 AM

## 2016-05-01 NOTE — Progress Notes (Signed)
Utilization review completed.  L. J. Janeane Cozart RN, BSN, CM 
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
## 2016-05-01 NOTE — Progress Notes (Signed)
Patient ID: Meghan [name], female   DOB: 05-23-1991, 25 y.o.   MRN: [id number] Postpartum day one Blood pressure 105/58 pulse 75 respiration 18 Fundus firm Lochia moderate Legs negative doing well

## 2016-05-02 NOTE — Lactation Note (Signed)
This note was copied from a baby's chart. Lactation Consultation Note Mom reports baby has been breast feeding well. Asleep on mom's chest at present. LS 7-9, 4 voids, 2 stools in last 24 hours, 3% wt loss. No questions at present. To call prn  Patient Name: Meghan [name]: 05/02/2016 Reason for consult: Follow-up assessment;Infant < 6lbs   Maternal Data Formula Feeding for Exclusion: No Has patient been taught Hand Expression?: Yes Does the patient have breastfeeding experience prior to this delivery?: Yes  Feeding Feeding Type: Breast Fed Length of feed: 10 min  LATCH Score/Interventions                      Lactation Tools Discussed/Used     Consult Status Consult Status: Complete    Pamelia HoitWeeks, Lasasha Brophy D 05/02/2016, 8:41 AM

## 2016-05-02 NOTE — Progress Notes (Signed)
Patient ID: Meghan RingerLakin J Gama, female   DOB: 1991/05/26, 25 y.o.   MRN: [id number] Postpartum day 2 Blood pressure 103/60 pulse 74 respiration 18 Fundus firm Lochia moderate Legs negative doing well home today

## 2016-05-02 NOTE — Discharge Summary (Signed)
Obstetric Discharge Summary Reason for Admission: onset of labor and observation/evaluation Prenatal Procedures: none Intrapartum Procedures: spontaneous vaginal delivery Postpartum Procedures: none Complications-Operative and Postpartum: none HEMOGLOBIN  Date Value Ref Range Status  05/01/2016 8.6* 12.0 - 15.0 g/dL Final   HCT  Date Value Ref Range Status  05/01/2016 25.6* 36.0 - 46.0 % Final   HEMATOCRIT  Date Value Ref Range Status  02/13/2016 33.2* 34.0 - 46.6 % Final    Physical Exam:  General: alert Lochia: appropriate Uterine Fundus: firm Incision: healing well DVT Evaluation: No evidence of DVT seen on physical exam.  Discharge Diagnoses: Term Pregnancy-delivered  Discharge Information: Date: 05/02/2016 Activity: pelvic rest Diet: routine Medications: Tylenol #3 Condition: stable Instructions: refer to practice specific booklet Discharge to: home Follow-up Information    Follow up with HARPER,CHARLES A, MD.   Specialty:  Obstetrics and Gynecology   Contact information:   [address] [place] [id number] [phone]       Newborn Data: Live born female  Birth Weight: 5 lb 9.2 oz (2530 g) APGAR: 8, 9  Home with mother.  Meghan [name] A 05/02/2016, 6:09 AM

## 2016-05-02 NOTE — Discharge Instructions (Signed)
Discharge instructions   You can wash your hair  Shower  Eat what you want  Drink what you want  See me in 6 weeks  Your ankles are going to swell more in the next 2 weeks than when pregnant  No sex for 6 weeks   Zoe Creasman A, MD 05/02/2016

## 2016-06-16 ENCOUNTER — Other Ambulatory Visit: Payer: Medicaid Other

## 2016-06-16 ENCOUNTER — Telehealth: Payer: Self-pay | Admitting: *Deleted

## 2016-06-16 DIAGNOSIS — N390 Urinary tract infection, site not specified: Secondary | ICD-10-CM

## 2016-06-16 MED ORDER — AMOXICILLIN-POT CLAVULANATE 875-125 MG PO TABS: 1.0000 | ORAL_TABLET | Freq: Two times a day (BID) | ORAL | 0 refills | Status: DC

## 2016-06-16 NOTE — Telephone Encounter (Signed)
Patient thinks she has a UTI- she has been experiencing odor with urination, pain, dizziness, and back pain. Patient states she was trying to wait until next week- but she doesn't think she can. Offered an appointment - but she can't come today- she can drop off a specimen. Will let provider know and get her treatment started. Per Boykin Reaperachelle- start Augmentin and samples of Uribel given. Culture to be ordered.

## 2016-06-18 ENCOUNTER — Other Ambulatory Visit: Payer: Self-pay | Admitting: *Deleted

## 2016-06-18 DIAGNOSIS — N390 Urinary tract infection, site not specified: Secondary | ICD-10-CM

## 2016-06-18 LAB — URINE CULTURE

## 2016-06-18 MED ORDER — AMOXICILLIN-POT CLAVULANATE 875-125 MG PO TABS: 1.0000 | ORAL_TABLET | Freq: Two times a day (BID) | ORAL | 0 refills | Status: DC

## 2016-06-18 MED ORDER — NITROFURANTOIN MONOHYD MACRO 100 MG PO CAPS: 100.0000 mg | ORAL_CAPSULE | Freq: Two times a day (BID) | ORAL | 0 refills | Status: DC

## 2016-06-18 NOTE — Progress Notes (Unsigned)
UC results reviewed with Dr Clearance CootsHarper. Rx for Augmentin was changed to Macrobid due to sensitivity. Pt aware.

## 2016-06-22 ENCOUNTER — Other Ambulatory Visit: Payer: Self-pay | Admitting: Certified Nurse Midwife

## 2016-06-22 DIAGNOSIS — N390 Urinary tract infection, site not specified: Secondary | ICD-10-CM

## 2016-06-22 MED ORDER — SULFAMETHOXAZOLE-TRIMETHOPRIM 800-160 MG PO TABS: 1.0000 | ORAL_TABLET | Freq: Two times a day (BID) | ORAL | 0 refills | Status: DC

## 2016-06-24 ENCOUNTER — Encounter: Payer: Self-pay | Admitting: *Deleted

## 2016-06-24 ENCOUNTER — Telehealth: Payer: Self-pay | Admitting: *Deleted

## 2016-06-24 NOTE — Telephone Encounter (Signed)
Letter sent about lab result and medication change.Unable to reach by phone.

## 2016-06-25 ENCOUNTER — Ambulatory Visit: Payer: Medicaid Other | Admitting: Certified Nurse Midwife

## 2016-07-15 ENCOUNTER — Ambulatory Visit: Payer: Medicaid Other | Admitting: Certified Nurse Midwife

## 2016-08-10 ENCOUNTER — Inpatient Hospital Stay (HOSPITAL_COMMUNITY): Payer: Medicaid Other

## 2016-08-10 ENCOUNTER — Inpatient Hospital Stay (HOSPITAL_COMMUNITY)
Admission: AD | Admit: 2016-08-10 | Discharge: 2016-08-10 | Disposition: A | Discharge status: Home / Self Care | Payer: Medicaid Other | Source: Ambulatory Visit | Attending: Obstetrics and Gynecology | Admitting: Obstetrics and Gynecology

## 2016-08-10 ENCOUNTER — Encounter (HOSPITAL_COMMUNITY): Payer: Self-pay | Admitting: *Deleted

## 2016-08-10 DIAGNOSIS — Z3A10 10 weeks gestation of pregnancy: Secondary | ICD-10-CM

## 2016-08-10 DIAGNOSIS — O209 Hemorrhage in early pregnancy, unspecified: Secondary | ICD-10-CM | POA: Diagnosis not present

## 2016-08-10 DIAGNOSIS — O23591 Infection of other part of genital tract in pregnancy, first trimester: Secondary | ICD-10-CM | POA: Diagnosis not present

## 2016-08-10 DIAGNOSIS — N760 Acute vaginitis: Secondary | ICD-10-CM

## 2016-08-10 DIAGNOSIS — Z3A09 9 weeks gestation of pregnancy: Secondary | ICD-10-CM | POA: Insufficient documentation

## 2016-08-10 DIAGNOSIS — B9689 Other specified bacterial agents as the cause of diseases classified elsewhere: Secondary | ICD-10-CM | POA: Diagnosis not present

## 2016-08-10 LAB — URINALYSIS, ROUTINE W REFLEX MICROSCOPIC
BILIRUBIN URINE: NEGATIVE
Glucose, UA: NEGATIVE mg/dL
KETONES UR: NEGATIVE mg/dL
Leukocytes, UA: NEGATIVE
Nitrite: NEGATIVE
PROTEIN: NEGATIVE mg/dL
Specific Gravity, Urine: 1.02 (ref 1.005–1.030)
pH: 7 (ref 5.0–8.0)

## 2016-08-10 LAB — URINE MICROSCOPIC-ADD ON

## 2016-08-10 LAB — CBC
HCT: 34 % — ABNORMAL LOW (ref 36.0–46.0)
Hemoglobin: 11.1 g/dL — ABNORMAL LOW (ref 12.0–15.0)
MCH: 25.6 pg — ABNORMAL LOW (ref 26.0–34.0)
MCHC: 32.6 g/dL (ref 30.0–36.0)
MCV: 78.3 fL (ref 78.0–100.0)
PLATELETS: 272 10*3/uL (ref 150–400)
RBC: 4.34 MIL/uL (ref 3.87–5.11)
RDW: 15.4 % (ref 11.5–15.5)
WBC: 9.1 10*3/uL (ref 4.0–10.5)

## 2016-08-10 LAB — HCG, QUANTITATIVE, PREGNANCY: HCG, BETA CHAIN, QUANT, S: 135 m[IU]/mL — AB (ref <5)

## 2016-08-10 LAB — POCT PREGNANCY, URINE: PREG TEST UR: POSITIVE — AB

## 2016-08-10 LAB — WET PREP, GENITAL
Sperm: NONE SEEN
TRICH WET PREP: NONE SEEN
YEAST WET PREP: NONE SEEN

## 2016-08-10 MED ORDER — METRONIDAZOLE 500 MG PO TABS: 500.0000 mg | ORAL_TABLET | Freq: Two times a day (BID) | ORAL | 0 refills | Status: AC

## 2016-08-10 NOTE — MAU Note (Signed)
Pt states she has heavy bleeding that started this morning, has changed 4 pads today.  Lower back pain started last night, also abd pain - both continue today.  Pos HPT the beginning of September.

## 2016-08-10 NOTE — Discharge Instructions (Signed)
Bacterial Vaginosis °Bacterial vaginosis is a vaginal infection that occurs when the normal balance of bacteria in the vagina is disrupted. It results from an overgrowth of certain bacteria. This is the most common vaginal infection in women of childbearing age. Treatment is important to prevent complications, especially in pregnant women, as it can cause a premature delivery. °CAUSES  °Bacterial vaginosis is caused by an increase in harmful bacteria that are normally present in smaller amounts in the vagina. Several different kinds of bacteria can cause bacterial vaginosis. However, the reason that the condition develops is not fully understood. °RISK FACTORS °Certain activities or behaviors can put you at an increased risk of developing bacterial vaginosis, including: °· Having a new sex partner or multiple sex partners. °· Douching. °· Using an intrauterine device (IUD) for contraception. °Women do not get bacterial vaginosis from toilet seats, bedding, swimming pools, or contact with objects around them. °SIGNS AND SYMPTOMS  °Some women with bacterial vaginosis have no signs or symptoms. Common symptoms include: °· Grey vaginal discharge. °· A fishlike odor with discharge, especially after sexual intercourse. °· Itching or burning of the vagina and vulva. °· Burning or pain with urination. °DIAGNOSIS  °Your health care provider will take a medical history and examine the vagina for signs of bacterial vaginosis. A sample of vaginal fluid may be taken. Your health care provider will look at this sample under a microscope to check for bacteria and abnormal cells. A vaginal pH test may also be done.  °TREATMENT  °Bacterial vaginosis may be treated with antibiotic medicines. These may be given in the form of a pill or a vaginal cream. A second round of antibiotics may be prescribed if the condition comes back after treatment. Because bacterial vaginosis increases your risk for sexually transmitted diseases, getting  treated can help reduce your risk for chlamydia, gonorrhea, HIV, and herpes. °HOME CARE INSTRUCTIONS  °· Only take over-the-counter or prescription medicines as directed by your health care provider. °· If antibiotic medicine was prescribed, take it as directed. Make sure you finish it even if you start to feel better. °· Tell all sexual partners that you have a vaginal infection. They should see their health care provider and be treated if they have problems, such as a mild rash or itching. °· During treatment, it is important that you follow these instructions: °· Avoid sexual activity or use condoms correctly. °· Do not douche. °· Avoid alcohol as directed by your health care provider. °· Avoid breastfeeding as directed by your health care provider. °SEEK MEDICAL CARE IF:  °· Your symptoms are not improving after 3 days of treatment. °· You have increased discharge or pain. °· You have a fever. °MAKE SURE YOU:  °· Understand these instructions. °· Will watch your condition. °· Will get help right away if you are not doing well or get worse. °FOR MORE INFORMATION  °Centers for Disease Control and Prevention, Division of STD Prevention: www.cdc.gov/std °American Sexual Health Association (ASHA): www.ashastd.org  °  °This information is not intended to replace advice given to you by your health care provider. Make sure you discuss any questions you have with your health care provider. °  °Document Released: 10/25/2005 Document Revised: 11/15/2014 Document Reviewed: 06/06/2013 °Elsevier Interactive Patient Education ©2016 Elsevier Inc. ° °Threatened Miscarriage °A threatened miscarriage occurs when you have vaginal bleeding during your first 20 weeks of pregnancy but the pregnancy has not ended. If you have vaginal bleeding during this time, your health care provider   do tests to make sure you are still pregnant. If the tests show you are still pregnant and the developing baby (fetus) inside your womb (uterus) is  still growing, your condition is considered a threatened miscarriage. A threatened miscarriage does not mean your pregnancy will end, but it does increase the risk of losing your pregnancy (complete miscarriage). CAUSES  The cause of a threatened miscarriage is usually not known. If you go on to have a complete miscarriage, the most common cause is an abnormal number of chromosomes in the developing baby. Chromosomes are the structures inside cells that hold all your genetic material. Some causes of vaginal bleeding that do not result in miscarriage include:  Having sex.  Having an infection.  Normal hormone changes of pregnancy.  Bleeding that occurs when an egg implants in your uterus. RISK FACTORS Risk factors for bleeding in early pregnancy include:  Obesity.  Smoking.  Drinking excessive amounts of alcohol or caffeine.  Recreational drug use. SIGNS AND SYMPTOMS  Light vaginal bleeding.  Mild abdominal pain or cramps. DIAGNOSIS  If you have bleeding with or without abdominal pain before 20 weeks of pregnancy, your health care provider will do tests to check whether you are still pregnant. One important test involves using sound waves and a computer (ultrasound) to create images of the inside of your uterus. Other tests include an internal exam of your vagina and uterus (pelvic exam) and measurement of your baby's heart rate.  You may be diagnosed with a threatened miscarriage if:  Ultrasound testing shows you are still pregnant.  Your baby's heart rate is strong.  A pelvic exam shows that the opening between your uterus and your vagina (cervix) is closed.  Your heart rate and blood pressure are stable.  Blood tests confirm you are still pregnant. TREATMENT  No treatments have been shown to prevent a threatened miscarriage from going on to a complete miscarriage. However, the right home care is important.  HOME CARE INSTRUCTIONS   Make sure you keep all your  appointments for prenatal care. This is very important.  Get plenty of rest.  Do not have sex or use tampons if you have vaginal bleeding.  Do not douche.  Do not smoke or use recreational drugs.  Do not drink alcohol.  Avoid caffeine. SEEK MEDICAL CARE IF:  You have light vaginal bleeding or spotting while pregnant.  You have abdominal pain or cramping.  You have a fever. SEEK IMMEDIATE MEDICAL CARE IF:  You have heavy vaginal bleeding.  You have blood clots coming from your vagina.  You have severe low back pain or abdominal cramps.  You have fever, chills, and severe abdominal pain. MAKE SURE YOU:  Understand these instructions.  Will watch your condition.  Will get help right away if you are not doing well or get worse.   This information is not intended to replace advice given to you by your health care provider. Make sure you discuss any questions you have with your health care provider.   Document Released: 10/25/2005 Document Revised: 10/30/2013 Document Reviewed: 08/21/2013 Elsevier Interactive Patient Education [employer]2016 Elsevier Inc.

## 2016-08-10 NOTE — MAU Provider Note (Signed)
Chief Complaint: Vaginal Bleeding; Abdominal Pain; and Back Pain   First Provider Initiated Contact with Patient 08/10/16 1616        SUBJECTIVE HPI: Meghan [name] is a 25 y.o. G3P2002 at [redacted]w[redacted]d by LMP who presents to maternity admissions reporting heavy bleeding starting this morning.  Has some low back pain. She denies vaginal bleeding, vaginal itching/burning, urinary symptoms, h/a, dizziness, n/v, or fever/chills.    Vaginal Bleeding  The patient's primary symptoms include vaginal bleeding. The patient's pertinent negatives include no genital itching, genital lesions, genital odor or pelvic pain. This is a new problem. The current episode started today. The problem occurs intermittently. The problem has been unchanged. The pain is mild. The problem affects both sides. She is pregnant. Pertinent negatives include no abdominal pain, chills, constipation, diarrhea, fever, flank pain, headaches, nausea or vomiting. The vaginal discharge was bloody. The vaginal bleeding is heavier than menses. She has not been passing clots. She has not been passing tissue. Nothing aggravates the symptoms. She has tried nothing for the symptoms.   RN Note: Pt states she has heavy bleeding that started this morning, has changed 4 pads today.  Lower back pain started last night, also abd pain - both continue today.  Pos HPT the beginning of September.  Past Medical History:  Diagnosis Date  . ADHD (attention deficit hyperactivity disorder)   . Anxiety   . Asthma   . Chronic kidney disease    kidney stones  . Depression   . Headache(784.0)   . Obesity (BMI 30-39.9)    Pt  doesn't  "eat much"  does not eat veggies/fruit  . Post traumatic stress disorder 03/06/2015  . PTSD (post-traumatic stress disorder)    Past Surgical History:  Procedure Laterality Date  . CHOLECYSTECTOMY  2015  . TONSILLECTOMY  2016  . WISDOM TOOTH EXTRACTION     Social History   Social History  . Marital status: Single    Spouse  name: N/A  . Number of children: N/A  . Years of education: N/A   Occupational History  . Not on file.   Social History Main Topics  . Smoking status: Never Smoker  . Smokeless tobacco: Never Used  . Alcohol use No  . Drug use: No  . Sexual activity: Yes    Partners: Male    Birth control/ protection: None   Other Topics Concern  . Not on file   Social History Narrative  . No narrative on file   No current facility-administered medications on file prior to encounter.    No current outpatient prescriptions on file prior to encounter.   Allergies  Allergen Reactions  . Morphine And Related Other (See Comments)    Pt states that this medication does not work for her.     I have reviewed patient's Past Medical Hx, Surgical Hx, Family Hx, Social Hx, medications and allergies.   ROS:  Review of Systems  Constitutional: Negative for chills and fever.  Gastrointestinal: Negative for abdominal pain, constipation, diarrhea, nausea and vomiting.  Genitourinary: Positive for vaginal bleeding. Negative for flank pain and pelvic pain.  Neurological: Negative for headaches.   Other systems negative   Physical Exam  Patient Vitals for the past 24 hrs:  BP Temp Temp src Pulse Resp Height  08/10/16 1351 122/78 98.3 F (36.8 C) Oral 76 18 4\' 11"  (1.499 m)    Physical Exam  Constitutional: Well-developed, well-nourished female in no acute distress.  Cardiovascular: normal rate Respiratory: normal  effort GI: Abd soft, non-tender. Pos BS x 4 MS: Extremities nontender, no edema, normal ROM Neurologic: Alert and oriented x 4.  GU: Neg CVAT.  PELVIC EXAM: Cervix pink, visually closed, without lesion, Moderate bloody discharge, vaginal walls and external genitalia normal Bimanual exam: Cervix 0/long/high, firm, anterior, neg CMT, uterus nontender, nonenlarged, adnexa without tenderness, enlargement, or mass   LAB RESULTS Results for orders placed or performed during the  hospital encounter of 08/10/16 (from the past 24 hour(s))  Urinalysis, Routine w reflex microscopic (not at [facility])     Status: Abnormal   Collection Time: 08/10/16  1:55 PM  Result Value Ref Range   Color, Urine YELLOW YELLOW   APPearance CLEAR CLEAR   Specific Gravity, Urine 1.020 1.005 - 1.030   pH 7.0 5.0 - 8.0   Glucose, UA NEGATIVE NEGATIVE mg/dL   Hgb urine dipstick LARGE (A) NEGATIVE   Bilirubin Urine NEGATIVE NEGATIVE   Ketones, ur NEGATIVE NEGATIVE mg/dL   Protein, ur NEGATIVE NEGATIVE mg/dL   Nitrite NEGATIVE NEGATIVE   Leukocytes, UA NEGATIVE NEGATIVE  Urine microscopic-add on     Status: Abnormal   Collection Time: 08/10/16  1:55 PM  Result Value Ref Range   Squamous Epithelial / LPF 0-5 (A) NONE SEEN   WBC, UA 0-5 0 - 5 WBC/hpf   RBC / HPF 0-5 0 - 5 RBC/hpf   Bacteria, UA FEW (A) NONE SEEN   Urine-Other MUCOUS PRESENT   Pregnancy, urine POC     Status: Abnormal   Collection Time: 08/10/16  2:08 PM  Result Value Ref Range   Preg Test, Ur POSITIVE (A) NEGATIVE  hCG, quantitative, pregnancy     Status: Abnormal   Collection Time: 08/10/16  2:22 PM  Result Value Ref Range   hCG, Beta Chain, Quant, S 135 (H) <5 mIU/mL  CBC     Status: Abnormal   Collection Time: 08/10/16  2:42 PM  Result Value Ref Range   WBC 9.1 4.0 - 10.5 K/uL   RBC 4.34 3.87 - 5.11 MIL/uL   Hemoglobin 11.1 (L) 12.0 - 15.0 g/dL   HCT 62.9 (L) 52.8 - 41.3 %   MCV 78.3 78.0 - 100.0 fL   MCH 25.6 (L) 26.0 - 34.0 pg   MCHC 32.6 30.0 - 36.0 g/dL   RDW 24.4 01.0 - 27.2 %   Platelets 272 150 - 400 K/uL    A/POS/-- (12/08 1004)  IMAGING US Ob Comp Less 14 Wks  Result Date: 08/10/2016 CLINICAL DATA:  Vaginal bleeding. EXAM: OBSTETRIC <14 WK [place] AND TRANSVAGINAL OB US TECHNIQUE: Both transabdominal and transvaginal ultrasound examinations were performed for complete evaluation of the gestation as well as the maternal uterus, adnexal regions, and pelvic cul-de-sac. Transvaginal technique was performed  to assess early pregnancy. COMPARISON:  None. FINDINGS: Intrauterine gestational sac: Not visualized Yolk sac:  Not visualized Embryo:  Not visualized Cardiac Activity: Not visualized Subchorionic hemorrhage:  None visualized. Maternal uterus/adnexae: Unremarkable.  No free pelvic fluid. IMPRESSION: No intrauterine pregnancy visualized. No focal abnormality identified. Electronically Signed   By: Maisie Fus  Register   On: 08/10/2016 16:40   US Ob Transvaginal  Result Date: 08/10/2016 CLINICAL DATA:  Vaginal bleeding. EXAM: OBSTETRIC <14 WK [place] AND TRANSVAGINAL OB US TECHNIQUE: Both transabdominal and transvaginal ultrasound examinations were performed for complete evaluation of the gestation as well as the maternal uterus, adnexal regions, and pelvic cul-de-sac. Transvaginal technique was performed to assess early pregnancy. COMPARISON:  None. FINDINGS: Intrauterine gestational sac: Not  visualized Yolk sac:  Not visualized Embryo:  Not visualized Cardiac Activity: Not visualized Subchorionic hemorrhage:  None visualized. Maternal uterus/adnexae: Unremarkable.  No free pelvic fluid. IMPRESSION: No intrauterine pregnancy visualized. No focal abnormality identified. Electronically Signed   By: Maisie Fushomas  Register   On: 08/10/2016 16:40    MAU Management/MDM: Ordered usual first trimester r/o ectopic labs.   Pelvic exam and cultures done Will check baseline Ultrasound to rule out ectopic.  This bleeding/pain can represent a normal pregnancy with bleeding, spontaneous abortion or even an ectopic which can be life-threatening.  The process as listed above helps to determine which of these is present.  Discussed results with patient. Discussed we cannot know if this represents a normal pregnancy withbleeding, ectopic or threatened abortion.  ASSESSMENT 1. Vaginal bleeding in pregnancy, first trimester   2. Vaginal bleeding in pregnancy, first trimester   3.      BV  PLAN Discharge home Plan to repeat HCG  level in 48 hours in clinic per 11:00 am schedule Will repeat  Ultrasound in about 7-10 days if HCG levels double appropriately  Rx flagyl for BV  Pt stable at time of discharge. Encouraged to return here or to other Urgent Care/ED if she develops worsening of symptoms, increase in pain, fever, or other concerning symptoms.    Wynelle BourgeoisMarie Laszlo Ellerby CNM, MSN Certified Nurse-Midwife 08/10/2016  4:59 PM

## 2016-08-11 LAB — GC/CHLAMYDIA PROBE AMP (~~LOC~~) NOT AT ARMC
Chlamydia: NEGATIVE
NEISSERIA GONORRHEA: NEGATIVE

## 2016-08-13 ENCOUNTER — Ambulatory Visit: Payer: Medicaid Other | Admitting: *Deleted

## 2016-08-13 DIAGNOSIS — O3680X0 Pregnancy with inconclusive fetal viability, not applicable or unspecified: Secondary | ICD-10-CM

## 2016-08-13 LAB — HCG, QUANTITATIVE, PREGNANCY: hCG, Beta Chain, Quant, S: 22 m[IU]/mL — ABNORMAL HIGH (ref <5)

## 2016-08-13 NOTE — Progress Notes (Signed)
Pt in for 48 hr stat hcg. She has a pregnancy of unknown location. Her ultrasound on 10/3 showed no baby. Her hcg on that day was 135. Pt is experiencing some vaginal bleeding. Pts hcg level today is 22. Reviewed results with Dr. Debroah LoopArnold, he recommends no further hcg levels and for patient to followup if desired. Pt informed of results and plan. She already has followup with Dr. Clearance CootsHarper. She will call his office today.

## 2016-08-16 ENCOUNTER — Ambulatory Visit: Payer: Medicaid Other | Admitting: Obstetrics

## 2016-08-16 ENCOUNTER — Ambulatory Visit: Payer: Self-pay

## 2016-08-16 VITALS — BP 111/74 | HR 93 | Temp 98.4°F | Wt 197.8 lb

## 2016-08-20 ENCOUNTER — Ambulatory Visit: Payer: Self-pay | Admitting: Obstetrics

## 2016-08-23 ENCOUNTER — Ambulatory Visit: Payer: Medicaid Other | Admitting: Certified Nurse Midwife

## 2017-02-13 IMAGING — CR DG SACRUM/COCCYX 2+V
2 series · 2 of 2 positions shown · non-contrast
Comparison: None.

CLINICAL DATA: Fall with pain at the coccyx. Thirty weeks pregnant.
Initial encounter.

EXAM:
SACRUM AND COCCYX - 2+ VIEW

[sacrum lat (1 of 2)]
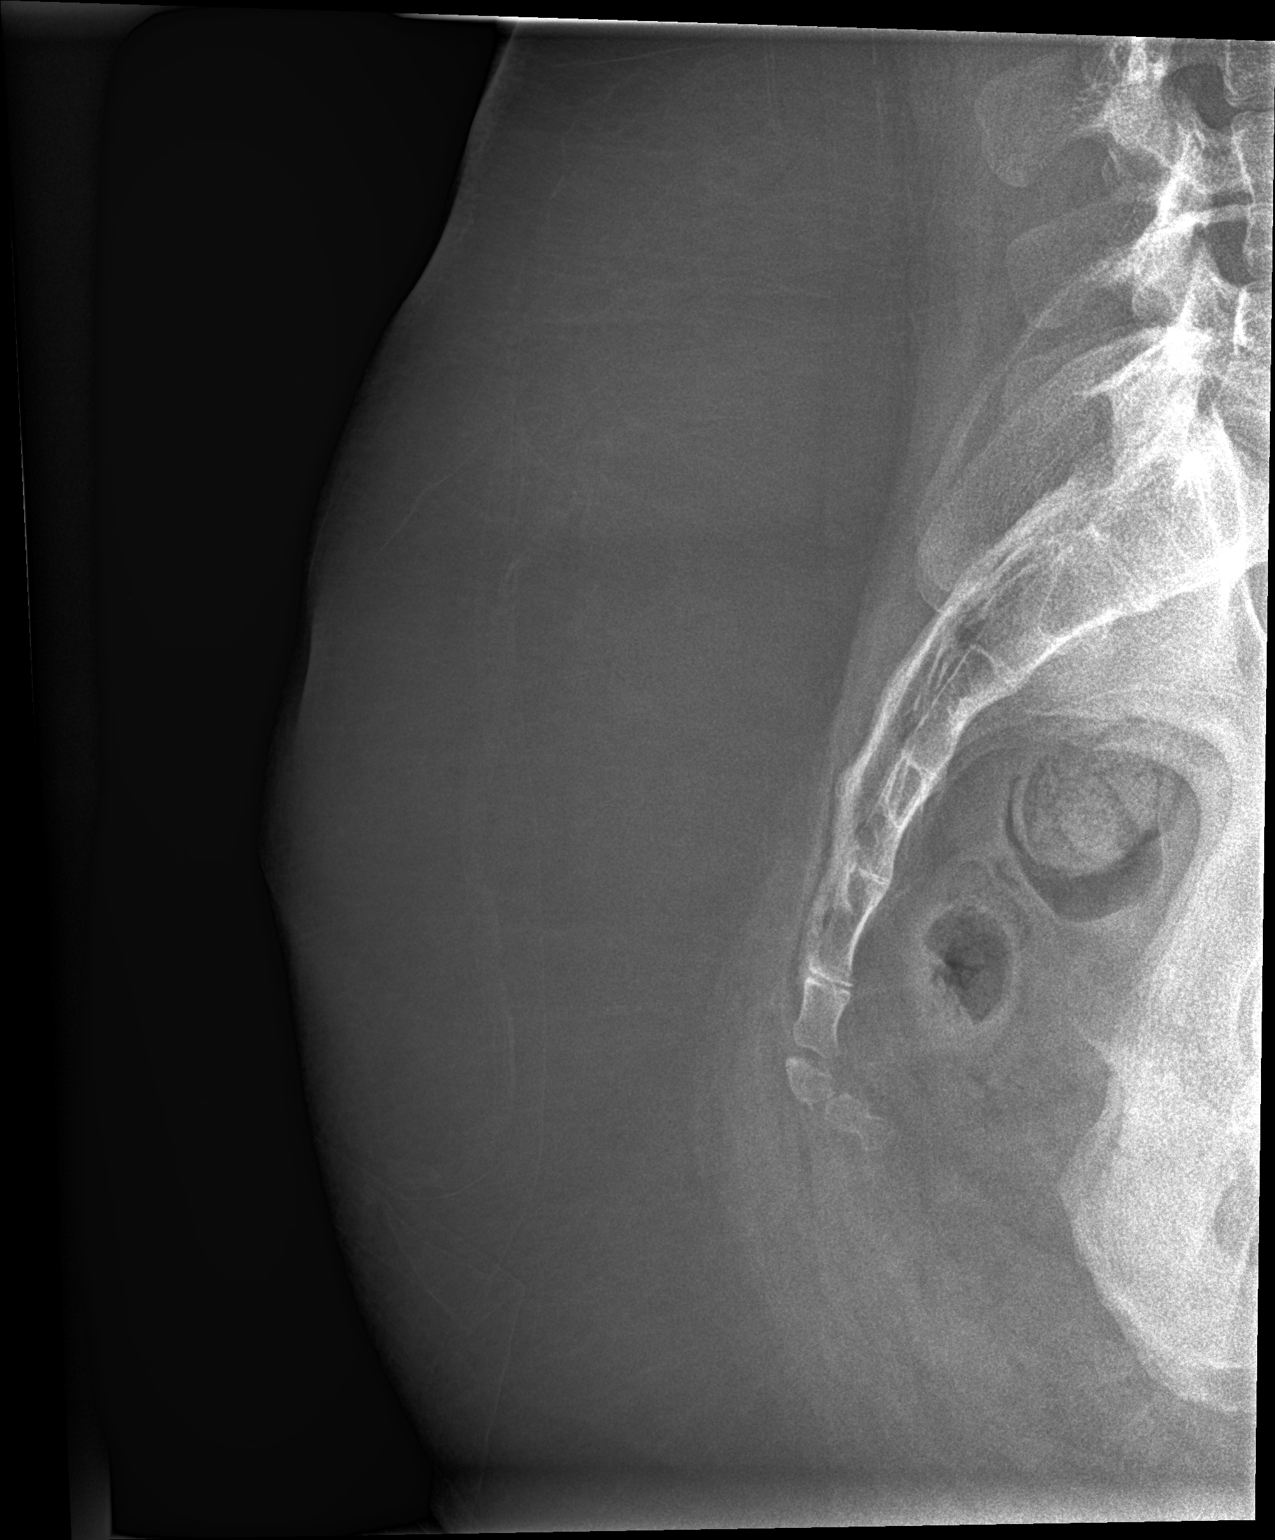

[sacrum lat (2 of 2)]
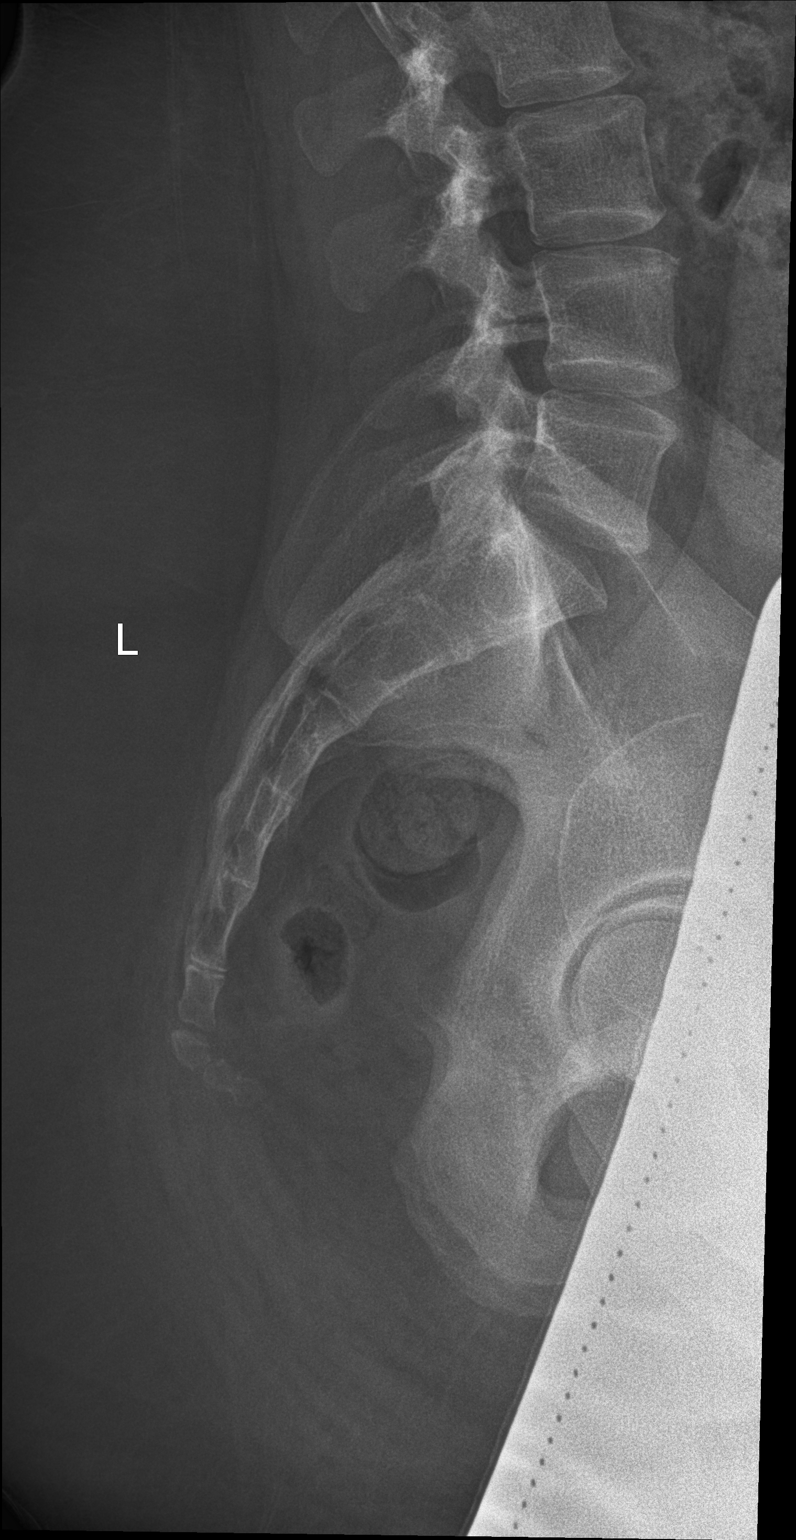

[2 of 2 positions shown; findings below may reference images not displayed]

FINDINGS: Normal lateral image of the sacrum and coccyx. The other visualized
osseous structures are also normal. Frontal imaging was not obtained
due to patient's gravid state and site of tenderness. Child is
likely laying cephalic.
IMPRESSION: Negative.

## 2017-03-11 IMAGING — US US MFM FETAL BPP W/O NON-STRESS
1 series · 15 of 20 positions shown · non-contrast
Comparison: none

[Series 1: us mfm fetal bpp w/o non-stress · 20 acquisitions, 15 frames shown]
[im 1/20]
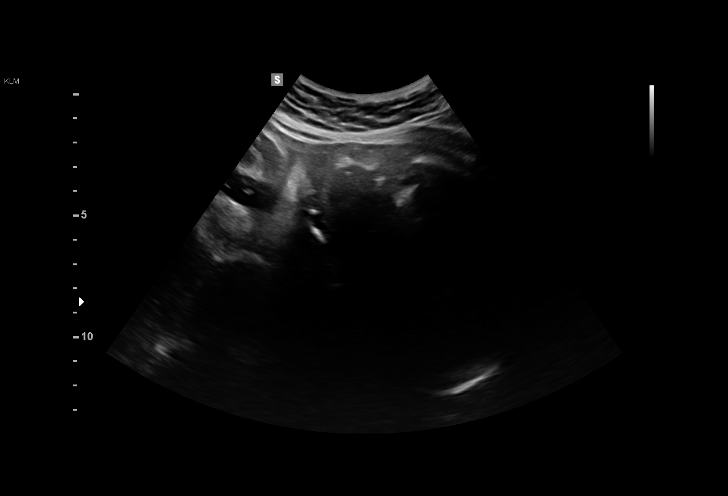
[im 3/20]
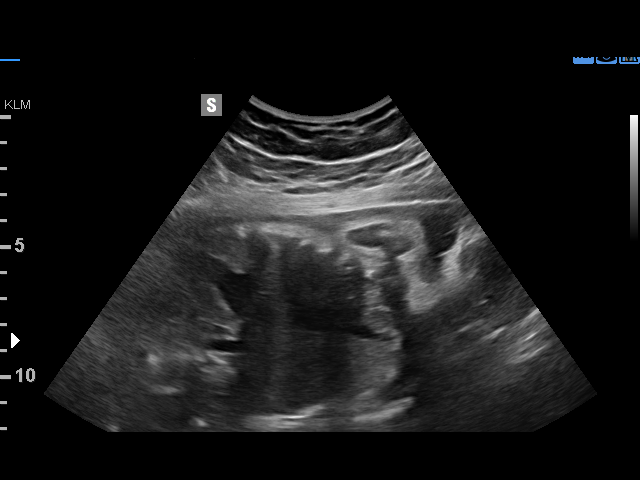
[im 4/20]
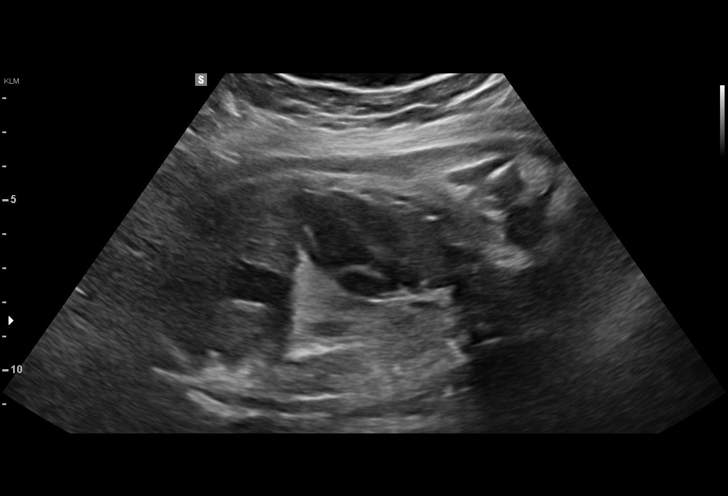
[im 5/20]
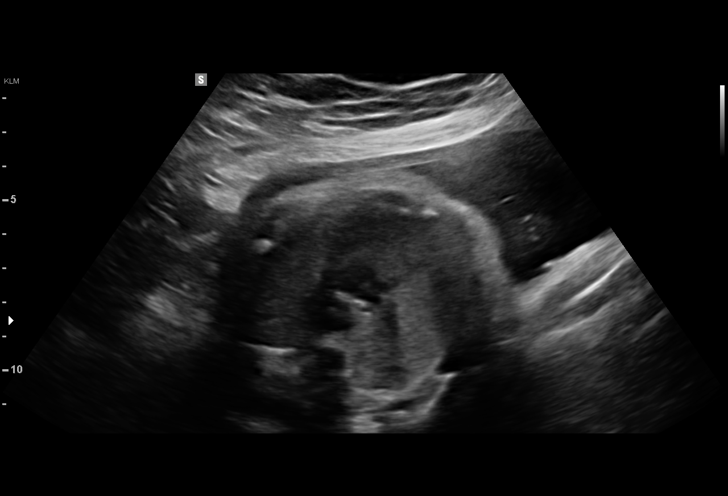
[im 7/20]
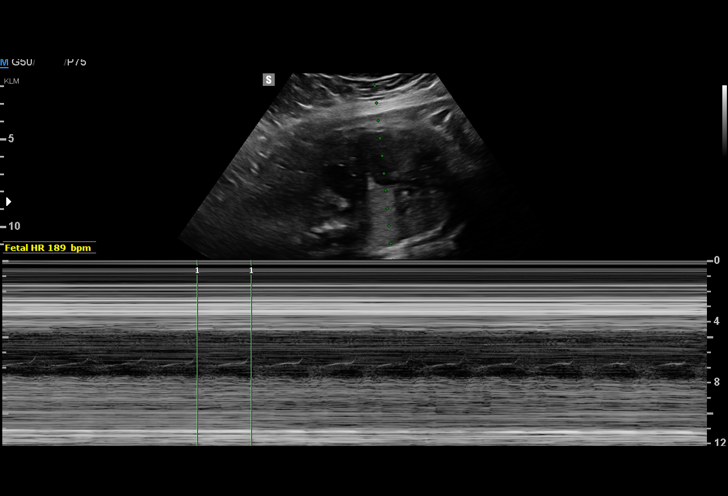
[im 8/20]
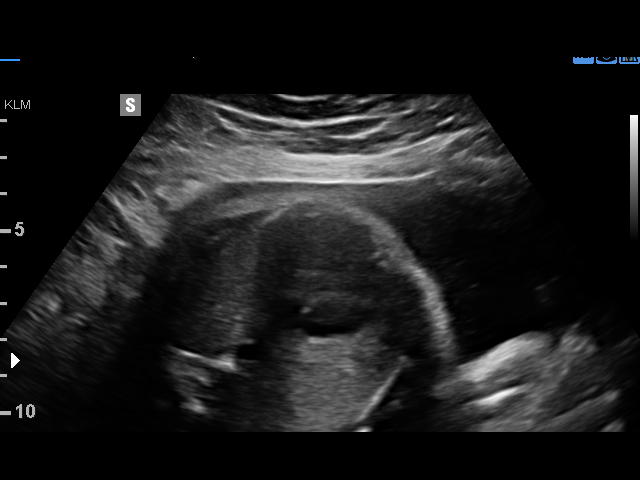
[im 9/20]
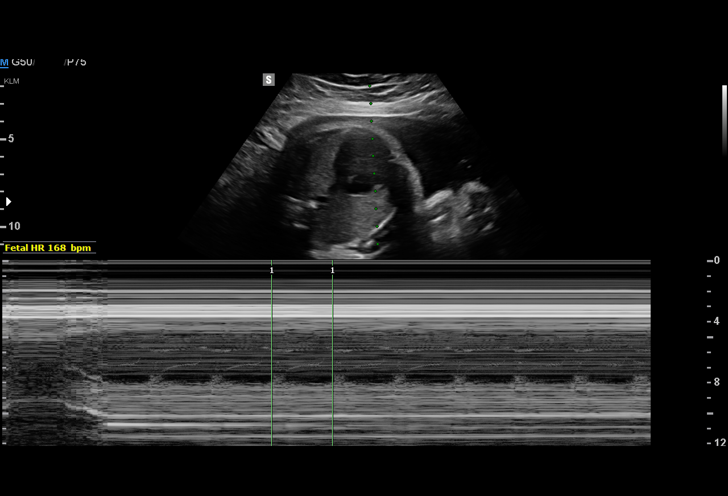
[im 11/20]
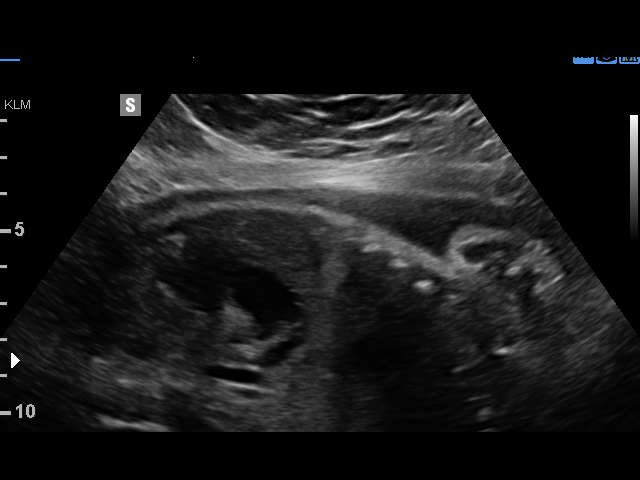
[im 12/20]
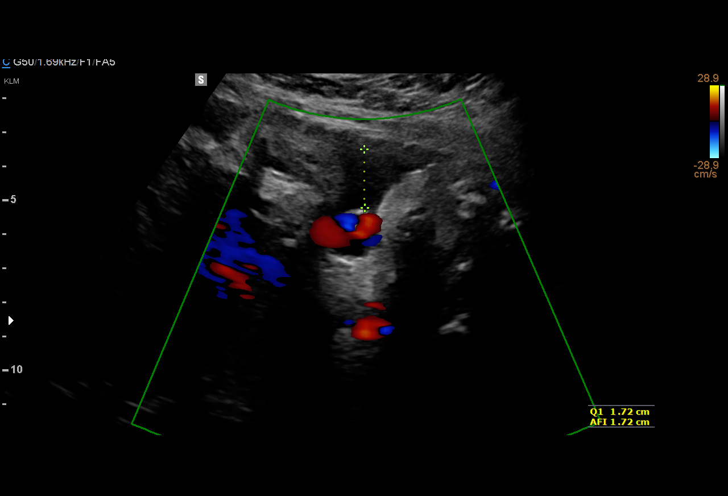
[im 13/20]
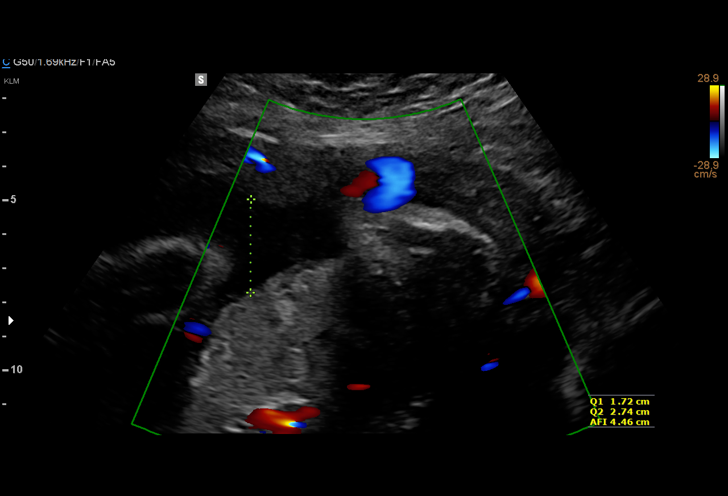
[im 15/20]
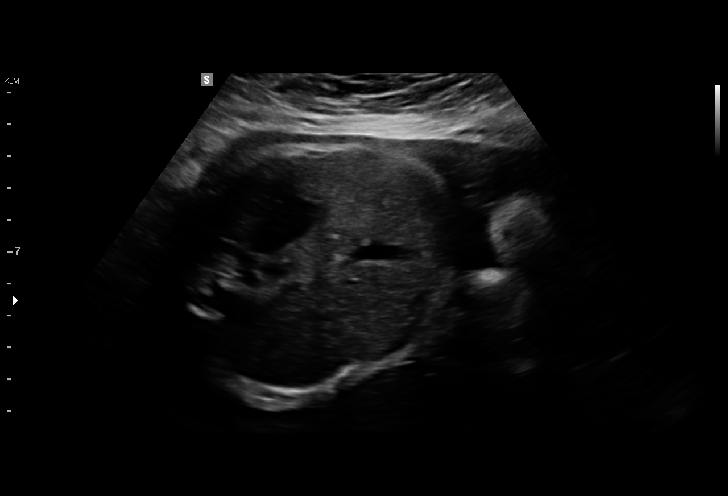
[im 16/20]
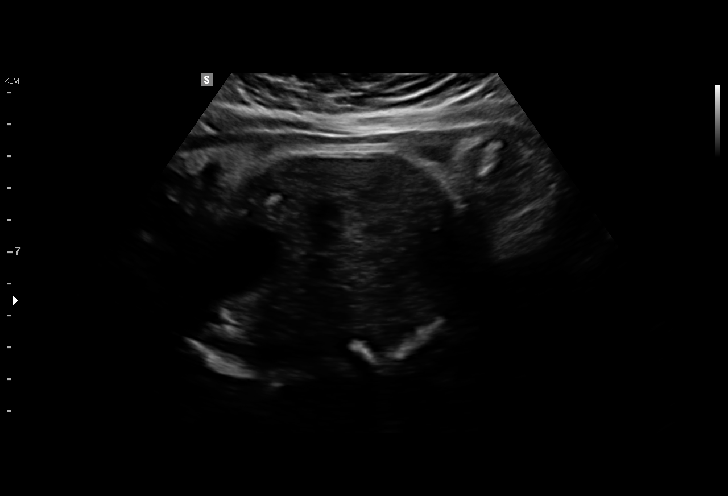
[im 17/20]
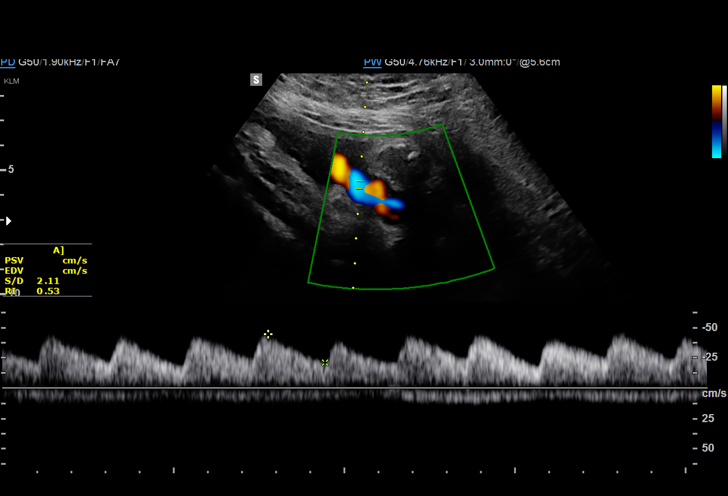
[im 19/20]
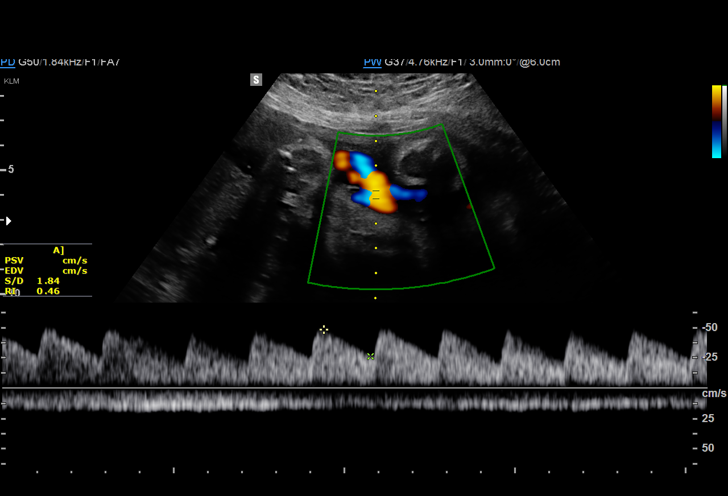
[im 20/20]
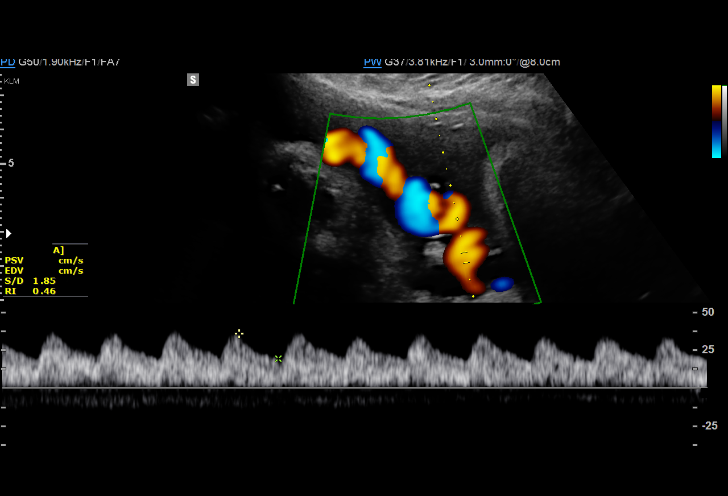

[15 of 20 positions shown; findings below may reference images not displayed]

1  FERIENHAUS ERXLEBEN            [id number]      [id number]     [id number]
2  FERIENHAUS ERXLEBEN            [id number]      [id number]     [id number]
Indications

33 weeks gestation of pregnancy
Medical complication of pregnancy (Kian Peng
nephropathy)
Poor obstetric history: Previous fetal growth
restriction (4+13 lb at term)
Obesity complicating pregnancy, third
trimester
Maternal care for known or suspected poor
fetal growth, third trimester, not applicable or
unspecified
OB History

Gravidity:    2         Term:   1        Prem:   0         SAB:   0
TOP:          0       Ectopic:  0        Living: 1
Fetal Evaluation

Num Of Fetuses:     1
Fetal Heart         150
Rate(bpm):
Cardiac Activity:   Observed
Presentation:       Cephalic

Amniotic Fluid
AFI FV:      Subjectively low-normal

AFI Sum(cm)     %Tile       Largest Pocket(cm)
5.7             < 3
RUQ(cm)       RLQ(cm)       LUQ(cm)
1.7
Biophysical Evaluation

Amniotic F.V:   Within normal limits       F. Tone:         Observed
F. Movement:    Observed                   Score:           [DATE]
F. Breathing:   Observed
Gestational Age

LMP:           27w 2d        Date:  09/14/15                 EDD:    06/20/16
Best:          33w 5d     Det. By:  Early Ultrasound         EDD:    05/06/16
(09/22/15)
Doppler - Fetal Vessels

Umbilical Artery
S/D     %tile
2.4       41

Impression

SIUP at 33+5 weeks
Cephalic presentation
Low normal amniotic fluid volume
BPP [DATE]
Recommendations

Continue weekly BPPs and UA dopplers
Growth US in 2 weeks

## 2017-04-09 IMAGING — US US MFM FETAL BPP W/O NON-STRESS
1 series · 13 of 28 positions shown · non-contrast
Comparison: none

[Series 1: us mfm fetal bpp w/o non-stress · 35 acquisitions, 13 frames shown]
[im 2/35]
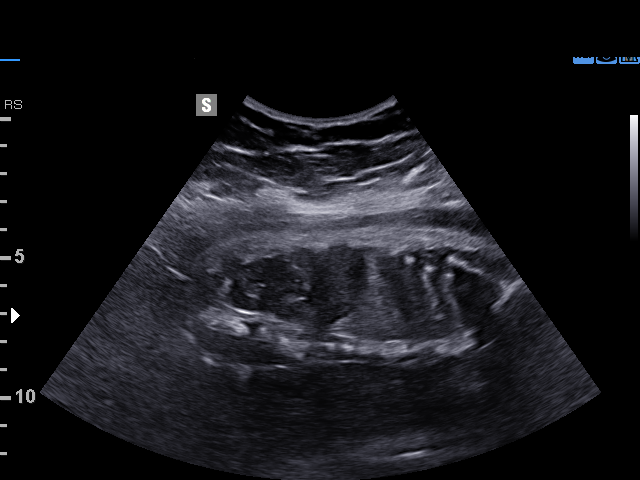
[im 4/35]
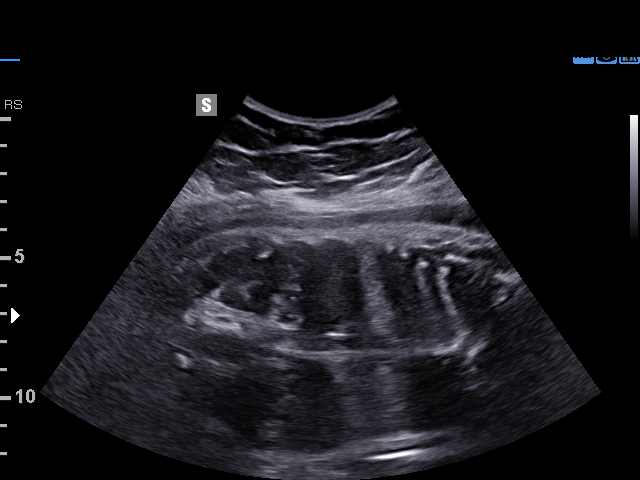
[im 7/35]
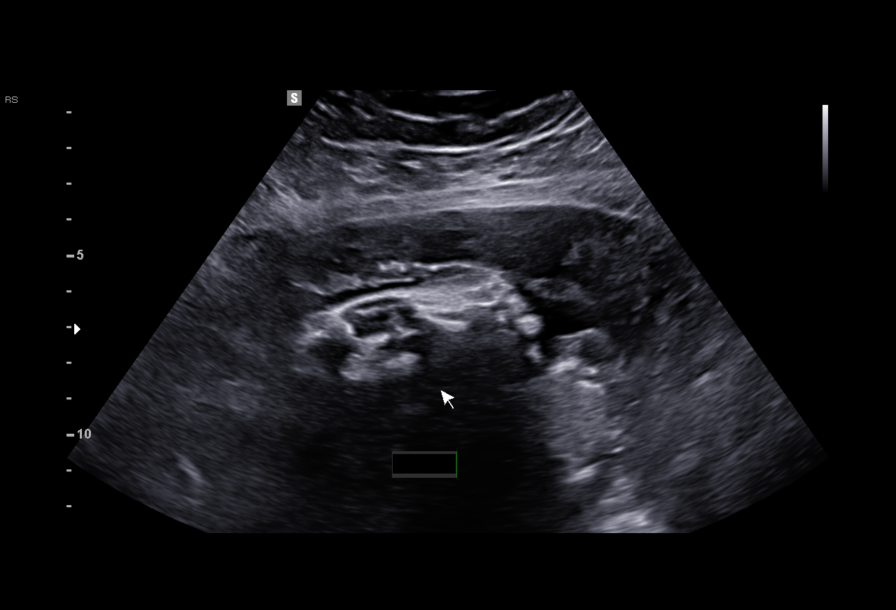
[im 9/35]
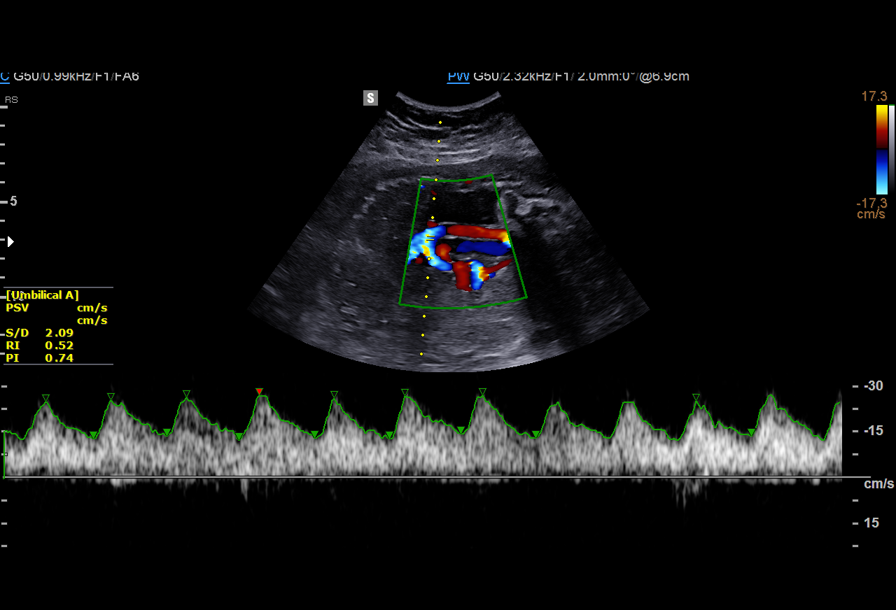
[im 12/35]
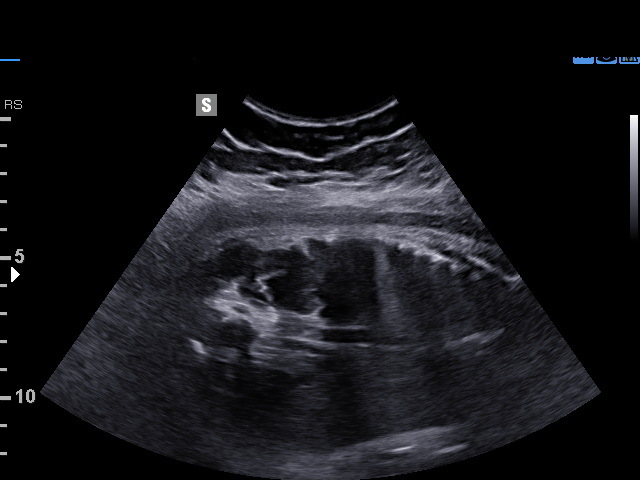
[im 14/35]
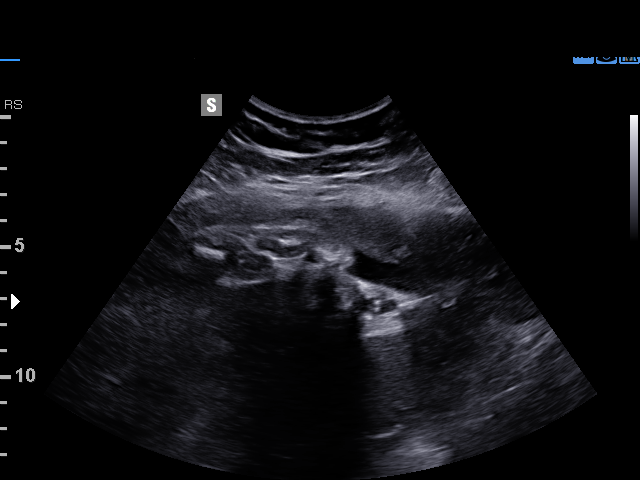
[im 18/35]
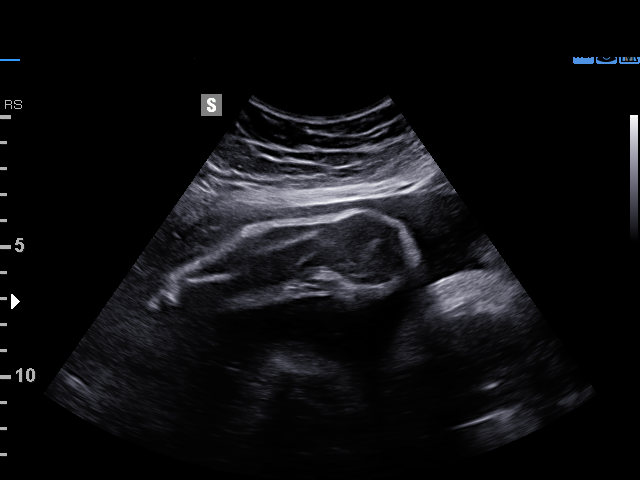
[im 21/35]
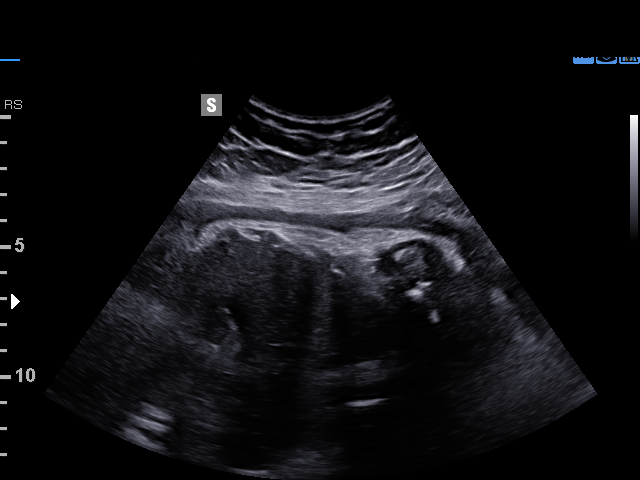
[im 23/35]
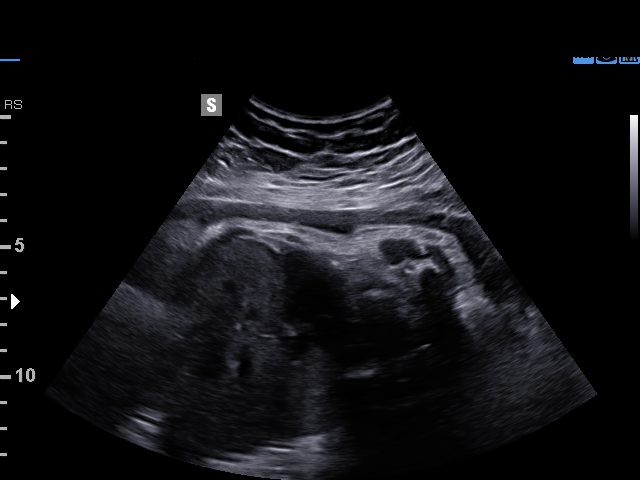
[im 26/35]
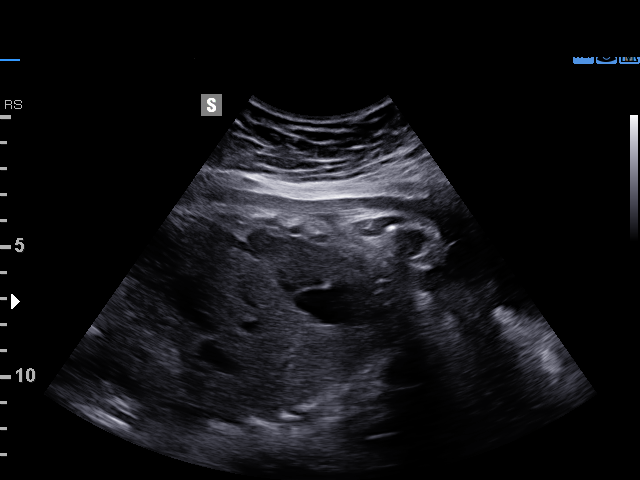
[im 28/35]
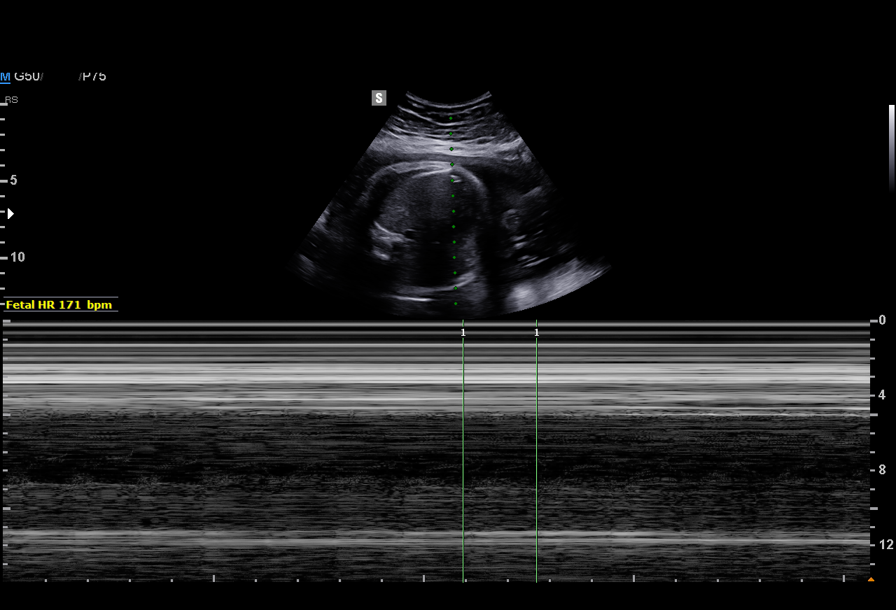
[im 31/35]
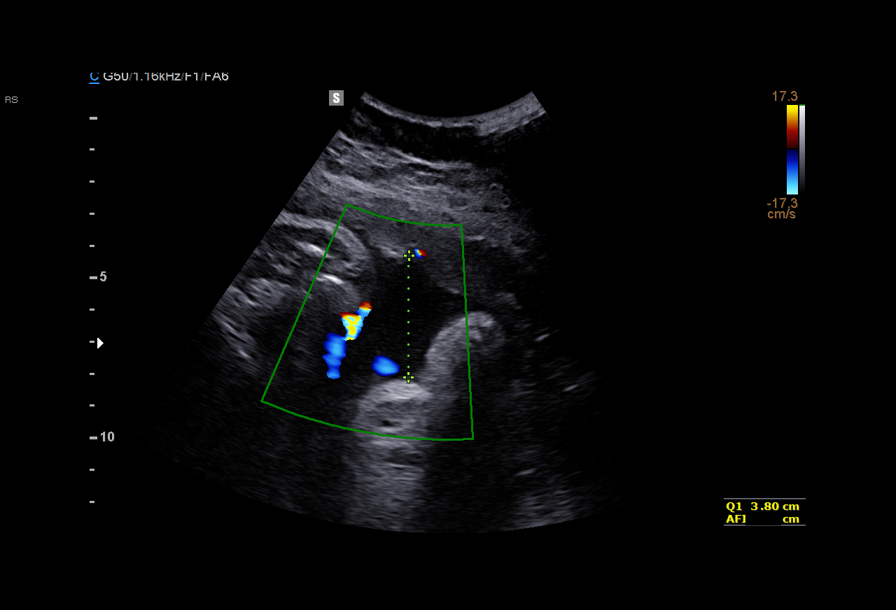
[im 33/35]
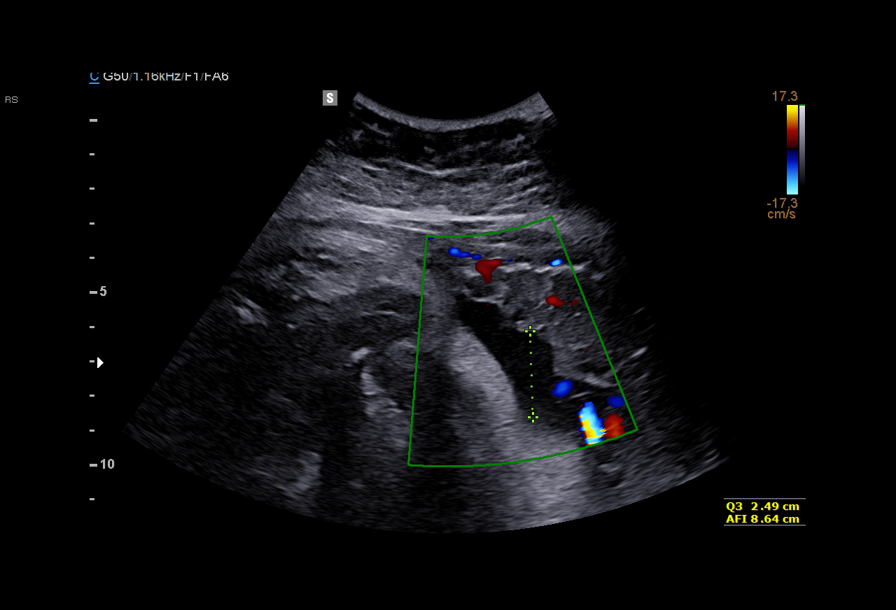

[13 of 28 positions shown; findings below may reference images not displayed]

1  STAMENOV INCALZA            [id number]      [id number]     [id number]
2  STAMENOV INCALZA            [id number]      [id number]     [id number]
Indications

37 weeks gestation of pregnancy
Medical complication of pregnancy (Jesusita
nephropathy)
Poor obstetric history: Previous fetal growth
restriction (4+13 lb at term)
Obesity complicating pregnancy, third
trimester
Maternal care for known or suspected poor
fetal growth, third trimester, not applicable or
unspecified
OB History

Gravidity:    2         Term:   1        Prem:   0        SAB:   0
TOP:          0       Ectopic:  0        Living: 1
Fetal Evaluation

Num Of Fetuses:     1
Fetal Heart         173
Rate(bpm):
Cardiac Activity:   Observed
Presentation:       Cephalic

Amniotic Fluid
AFI FV:      Subjectively within normal limits

AFI Sum(cm)     %Tile       Largest Pocket(cm)
11.41           36
RUQ(cm)       RLQ(cm)       LUQ(cm)        LLQ(cm)
2.49
Biophysical Evaluation

Amniotic F.V:   Pocket => 2 cm two         F. Tone:        Observed
planes
F. Movement:    Observed                   Score:          [DATE]
F. Breathing:   Observed
Gestational Age

LMP:           31w 3d       Date:   09/14/15                 EDD:   06/20/16
Best:          37w 6d    Det. By:   Early Ultrasound         EDD:   05/06/16
(09/22/15)
Doppler - Fetal Vessels

Umbilical Artery
S/D     %tile                                            ADFV    RDFV
1.91       24                                                No      No

Impression

SIUP at 37+6 weeks
Cephalic presentation
Normal amniotic fluid volume
BPP [DATE]
UA dopplers were normal for this GA
Recommendations

Induction planned for [DATE]

## 2017-06-15 ENCOUNTER — Encounter (HOSPITAL_COMMUNITY): Payer: Self-pay
